# Patient Record
Sex: Female | Born: 1960 | Race: White | Hispanic: No | Marital: Married | Smoking: Current every day smoker
Health system: Southern US, Community
[De-identification: ages and names within clinical notes are randomized; demographics above are authoritative.]

## PROBLEM LIST (undated history)

## (undated) DIAGNOSIS — E079 Disorder of thyroid, unspecified: Secondary | ICD-10-CM

## (undated) DIAGNOSIS — R109 Unspecified abdominal pain: Secondary | ICD-10-CM

## (undated) DIAGNOSIS — F419 Anxiety disorder, unspecified: Secondary | ICD-10-CM

## (undated) DIAGNOSIS — E039 Hypothyroidism, unspecified: Secondary | ICD-10-CM

## (undated) DIAGNOSIS — E785 Hyperlipidemia, unspecified: Secondary | ICD-10-CM

## (undated) HISTORY — DX: Disorder of thyroid, unspecified: E07.9

## (undated) HISTORY — DX: Hyperlipidemia, unspecified: E78.5

## (undated) HISTORY — PX: ABDOMINAL HYSTERECTOMY: SHX81

---

## 1986-03-30 HISTORY — PX: TUBAL LIGATION: SHX77

## 1994-07-28 HISTORY — PX: HYSTEROSCOPY: SHX211

## 2005-08-29 ENCOUNTER — Ambulatory Visit: Payer: Self-pay | Admitting: Pediatrics

## 2007-08-19 ENCOUNTER — Ambulatory Visit: Payer: Self-pay | Admitting: Family Medicine

## 2009-01-09 ENCOUNTER — Ambulatory Visit: Payer: Self-pay | Admitting: Family Medicine

## 2011-08-21 ENCOUNTER — Ambulatory Visit: Payer: Self-pay | Admitting: Family Medicine

## 2013-04-05 ENCOUNTER — Ambulatory Visit: Payer: Self-pay | Admitting: Family Medicine

## 2017-11-02 ENCOUNTER — Other Ambulatory Visit: Payer: Self-pay | Admitting: Family Medicine

## 2017-11-02 DIAGNOSIS — Z1231 Encounter for screening mammogram for malignant neoplasm of breast: Secondary | ICD-10-CM

## 2018-11-05 ENCOUNTER — Other Ambulatory Visit: Payer: Self-pay | Admitting: Family Medicine

## 2018-11-05 DIAGNOSIS — R1084 Generalized abdominal pain: Secondary | ICD-10-CM

## 2018-11-05 DIAGNOSIS — Z1231 Encounter for screening mammogram for malignant neoplasm of breast: Secondary | ICD-10-CM

## 2018-11-15 ENCOUNTER — Other Ambulatory Visit: Payer: Self-pay

## 2018-11-15 ENCOUNTER — Ambulatory Visit
Admission: RE | Admit: 2018-11-15 | Discharge: 2018-11-15 | Disposition: A | Discharge status: Home / Self Care | Source: Ambulatory Visit | Attending: Family Medicine | Admitting: Family Medicine

## 2018-11-15 DIAGNOSIS — R1084 Generalized abdominal pain: Secondary | ICD-10-CM

## 2018-12-09 ENCOUNTER — Ambulatory Visit (INDEPENDENT_AMBULATORY_CARE_PROVIDER_SITE_OTHER): Admitting: General Surgery

## 2018-12-09 ENCOUNTER — Ambulatory Visit: Payer: Self-pay

## 2018-12-09 ENCOUNTER — Encounter: Payer: Self-pay | Admitting: General Surgery

## 2018-12-09 ENCOUNTER — Other Ambulatory Visit: Payer: Self-pay

## 2018-12-09 VITALS — BP 110/75 | HR 82 | Temp 97.5°F | Ht 63.0 in | Wt 118.6 lb

## 2018-12-09 DIAGNOSIS — E041 Nontoxic single thyroid nodule: Secondary | ICD-10-CM

## 2018-12-09 DIAGNOSIS — K8020 Calculus of gallbladder without cholecystitis without obstruction: Secondary | ICD-10-CM

## 2018-12-09 DIAGNOSIS — E079 Disorder of thyroid, unspecified: Secondary | ICD-10-CM | POA: Diagnosis not present

## 2018-12-09 NOTE — Patient Instructions (Signed)
You have requested to have your gallbladder removed. This will be done at [facility] with Dr. Lady Garyannon.  You will most likely be out of work 1-2 weeks for this surgery. You will return after your post-op appointment with a lifting restriction for approximately 4 more weeks.  You will be able to eat anything you would like to following surgery. But, start by eating a bland diet and advance this as tolerated. The Gallbladder diet is below, please go as closely by this diet as possible prior to surgery to avoid any further attacks.  Please see the Pre Surgery form that you have been given today. If you have any questions, please call our office.  Laparoscopic Cholecystectomy Laparoscopic cholecystectomy is surgery to remove the gallbladder. The gallbladder is located in the upper right part of the abdomen, behind the liver. It is a storage sac for bile, which is produced in the liver. Bile aids in the digestion and absorption of fats. Cholecystectomy is often done for inflammation of the gallbladder (cholecystitis). This condition is usually caused by a buildup of gallstones (cholelithiasis) in the gallbladder. Gallstones can block the flow of bile, and that can result in inflammation and pain. In severe cases, emergency surgery may be required. If emergency surgery is not required, you will have time to prepare for the procedure. Laparoscopic surgery is an alternative to open surgery. Laparoscopic surgery has a shorter recovery time. Your common bile duct may also need to be examined during the procedure. If stones are found in the common bile duct, they may be removed. LET [facility] PROVIDER KNOW ABOUT:  Any allergies you have.  All medicines you are taking, including vitamins, herbs, eye drops, creams, and over-the-counter medicines.  Previous problems you or members of your family have had with the use of anesthetics.  Any blood disorders you have.  Previous surgeries you have  had.    Any medical conditions you have. RISKS AND COMPLICATIONS Generally, this is a safe procedure. However, problems may occur, including:  Infection.  Bleeding.  Allergic reactions to medicines.  Damage to other structures or organs.  A stone remaining in the common bile duct.  A bile leak from the cyst duct that is clipped when your gallbladder is removed.  The need to convert to open surgery, which requires a larger incision in the abdomen. This may be necessary if your surgeon thinks that it is not safe to continue with a laparoscopic procedure. BEFORE THE PROCEDURE  Ask your health care provider about:  Changing or stopping your regular medicines. This is especially important if you are taking diabetes medicines or blood thinners.  Taking medicines such as aspirin and ibuprofen. These medicines can thin your blood. Do not take these medicines before your procedure if your health care provider instructs you not to.  Follow instructions from your health care provider about eating or drinking restrictions.  Let your health care provider know if you develop a cold or an infection before surgery.  Plan to have someone take you home after the procedure.  Ask your health care provider how your surgical site will be marked or identified.  You may be given antibiotic medicine to help prevent infection. PROCEDURE  To reduce your risk of infection:  Your health care team will wash or sanitize their hands.  Your skin will be washed with soap.  An IV tube may be inserted into one of your veins.  You will be given a medicine to make you  fall asleep (general anesthetic).  A breathing tube will be placed in your mouth.  The surgeon will make several small cuts (incisions) in your abdomen.  A thin, lighted tube (laparoscope) that has a tiny camera on the end will be inserted through one of the small incisions. The camera on the laparoscope will send a picture to a TV  screen (monitor) in the operating room. This will give the surgeon a good view inside your abdomen.  A gas will be pumped into your abdomen. This will expand your abdomen to give the surgeon more room to perform the surgery.  Other tools that are needed for the procedure will be inserted through the other incisions. The gallbladder will be removed through one of the incisions.  After your gallbladder has been removed, the incisions will be closed with stitches (sutures), staples, or skin glue.  Your incisions may be covered with a bandage (dressing). The procedure may vary among health care providers and hospitals. AFTER THE PROCEDURE  Your blood pressure, heart rate, breathing rate, and blood oxygen level will be monitored often until the medicines you were given have worn off.  You will be given medicines as needed to control your pain.   This information is not intended to replace advice given to you by your health care provider. Make sure you discuss any questions you have with your health care provider.   Document Released: 03/24/2005 Document Revised: 12/13/2014 Document Reviewed: 11/03/2012 Elsevier Interactive Patient Education 2016 [place] Diet for Gallbladder Conditions A low-fat diet can be helpful if you have pancreatitis or a gallbladder condition. With these conditions, your pancreas and gallbladder have trouble digesting fats. A healthy eating plan with less fat will help rest your pancreas and gallbladder and reduce your symptoms. WHAT DO I NEED TO KNOW ABOUT THIS DIET?  Eat a low-fat diet.  Reduce your fat intake to less than 20-30% of your total daily calories. This is less than 50-60 g of fat per day.  Remember that you need some fat in your diet. Ask your dietician what your daily goal should be.  Choose nonfat and low-fat healthy foods. Look for the words "nonfat," "low fat," or "fat free."  As a guide, look on the label and choose foods with  less than 3 g of fat per serving. Eat only one serving.  Avoid alcohol.  Do not smoke. If you need help quitting, talk with your health care provider.  Eat small frequent meals instead of three large heavy meals. WHAT FOODS CAN I EAT? Grains Include healthy grains and starches such as potatoes, wheat bread, fiber-rich cereal, and brown rice. Choose whole grain options whenever possible. In adults, whole grains should account for 45-65% of your daily calories.  Fruits and Vegetables Eat plenty of fruits and vegetables. Fresh fruits and vegetables add fiber to your diet. Meats and Other Protein Sources Eat lean meat such as chicken and pork. Trim any fat off of meat before cooking it. Eggs, fish, and beans are other sources of protein. In adults, these foods should account for 10-35% of your daily calories. Dairy Choose low-fat milk and dairy options. Dairy includes fat and protein, as well as calcium.  Fats and Oils Limit high-fat foods such as fried foods, sweets, baked goods, sugary drinks.  Other Creamy sauces and condiments, such as mayonnaise, can add extra fat. Think about whether or not you need to use them, or use smaller amounts or low fat options. WHAT  FOODS ARE NOT RECOMMENDED?  High fat foods, such as:  [employer].  Ice cream.  [place] toast.  Sweet rolls.  Pizza.  Cheese bread.  Foods covered with batter, butter, creamy sauces, or cheese.  Fried foods.  Sugary drinks and desserts.  Foods that cause gas or bloating   This information is not intended to replace advice given to you by your health care provider. Make sure you discuss any questions you have with your health care provider.   Document Released: 03/29/2013 Document Reviewed: 03/29/2013 Elsevier Interactive Patient Education [employer]

## 2018-12-09 NOTE — H&P (View-Only) (Signed)
Patient ID: Joan Johns, female   DOB: 05/31/1960, 58 y.o.   MRN: [id number]  Chief Complaint  Patient presents with  . New Patient (Initial Visit)    gallbladder    HPI Joan Johns is a 58 y.o. female.  She was referred by her primary care provider, Dr. Linda Miles, for surgical evaluation of gallstones.  She reports having several months history of a "uncomfortable" feeling in her right upper quadrant.  The symptoms are localized to the area and last between 30 to 60 minutes.  They come and go about once every couple of weeks.  Occasionally she has nausea associated with the pain.  She endorses emesis once in a while, first thing in the morning after strings or coffee, but this is not associated with right upper quadrant pain or discomfort.  She says that acidic or spicy foods seem to aggravate and bring on the discomfort.  She denies association with fatty food intake.  She has not had any fevers or chills.  She denies ever having become jaundiced or having had pancreatitis.  She has never had any surgery in her upper abdomen; she has had a tubal ligation and partial hysterectomy in the past.   Past Medical History:  Diagnosis Date  . Hyperlipidemia   . Thyroid disease    hypothyroidism    Past Surgical History:  Procedure Laterality Date  . ABDOMINAL HYSTERECTOMY    . HYSTEROSCOPY  07/28/1994  . TUBAL LIGATION  03/30/1986    Family History  Problem Relation Age of Onset  . Celiac disease Sister     Social History Social History   Tobacco Use  . Smoking status: Current Every Day Smoker    Packs/day: 1.00  . Smokeless tobacco: Never Used  Substance Use Topics  . Alcohol use: Never    Frequency: Never  . Drug use: Never    Allergies  Allergen Reactions  . Ciprofloxacin Nausea And Vomiting    syncope  . Codeine Rash    Current Outpatient Medications  Medication Sig Dispense Refill  . Cholecalciferol (VITAMIN D3 PO) Take by mouth. Takes 1000 IU daily    .  levothyroxine (SYNTHROID) 75 MCG tablet Take 75 mcg by mouth daily.     No current facility-administered medications for this visit.     Review of Systems Review of Systems  All other systems reviewed and are negative.   Blood pressure 110/75, pulse 82, temperature (!) 97.5 F (36.4 C), height 5' 3" (1.6 m), weight 118 lb 9.6 oz (53.8 kg), SpO2 98 %. Today's Vitals   12/09/18 1436  BP: 110/75  Pulse: 82  Temp: (!) 97.5 F (36.4 C)  SpO2: 98%  Weight: 118 lb 9.6 oz (53.8 kg)  Height: 5' 3" (1.6 m)   Body mass index is 21.01 kg/m.  Physical Exam Physical Exam Constitutional:      General: She is not in acute distress.    Appearance: Normal appearance. She is normal weight.  HENT:     Head: Normocephalic and atraumatic.     Nose:     Comments: Covered with a mask secondary to COVID-19 precautions    Mouth/Throat:     Comments: Covered with a mask secondary to COVID-19 precautions Eyes:     General: No scleral icterus.       Right eye: No discharge.        Left eye: No discharge.     Conjunctiva/sclera: Conjunctivae normal.  Neck:       Musculoskeletal: Normal range of motion. No neck rigidity.     Comments: There is an approximately 1 cm irregularity versus thyroid nodule palpated on each side of the gland.  When moves freely with deglutition. Cardiovascular:     Rate and Rhythm: Normal rate and regular rhythm.     Pulses: Normal pulses.  Pulmonary:     Effort: Pulmonary effort is normal.     Breath sounds: Normal breath sounds.  Abdominal:     General: Abdomen is flat. Bowel sounds are normal. There is no distension.     Palpations: Abdomen is soft.     Tenderness: There is no abdominal tenderness. There is no guarding.  Genitourinary:    Comments: Deferred Musculoskeletal: Normal range of motion.        General: No swelling.  Lymphadenopathy:     Cervical: No cervical adenopathy.  Skin:    General: Skin is warm and dry.  Neurological:     General: No focal  deficit present.     Mental Status: She is alert and oriented to person, place, and time.  Psychiatric:        Behavior: Behavior normal.     Data Reviewed I reviewed the right upper quadrant ultrasound that was performed on August 10.  This describes Coley lithiasis without cholecystitis or choledocho lithiasis.  I concur with the radiologist impression, which is copied below:CLINICAL DATA:  Right upper quadrant abdominal pain.  EXAM: ULTRASOUND ABDOMEN LIMITED RIGHT UPPER QUADRANT  COMPARISON:  None.  FINDINGS: Gallbladder:  Cholelithiasis is noted without gallbladder wall thickening or pericholecystic fluid. Largest calculus measures 4 mm. No sonographic Murphy's sign is noted.  Common bile duct:  Diameter: 5 mm which is within normal limits.  Liver:  No focal lesion identified. Within normal limits in parenchymal echogenicity. Portal vein is patent on color Doppler imaging with normal direction of blood flow towards the liver.  Other: None.  IMPRESSION: Cholelithiasis without evidence of cholecystitis. No other abnormality seen in the right upper quadrant of the abdomen.  Dr. Lennox Grumbles' clinic note of November 29, 2018 was reviewed in hardcopy.  This included labs, including a CBC which was within normal limits a comprehensive metabolic panel, also within normal limits, specifically the transaminases and bilirubin.  Cholesterol is elevated with a total cholesterol of 229.  Hemoglobin A1c is normal.  Vitamin D is slightly low at 28.6.  Thyroid-stimulating hormone is on the low side of normal at 0.756   Assessment This is a 58 year old woman with right upper quadrant pain, symptoms most consistent with biliary colic.  She does have gallstones on ultrasound imaging.  She has no evidence of cholecystitis or choledocholithiasis.  She is also hypothyroid and on physical examination, I felt an irregularity on either side of the gland.  She is not sure why she is hypothyroid,  except that her lab work indicated as much and she was started on levothyroxine as result.   Plan I performed an ultrasound in clinic today to evaluate her thyroid.  What I appreciated as a possible nodule on physical exam, proved to be parenchymal irregularity distorting the contour of the gland.  The imaging is classic for Hashimoto's thyroiditis and the irregularities appear to be germinal centers rather than discrete nodules.  This certainly explains her hypothyroid state.  There were no nodules or other findings that would warrant biopsy at this time.  As for her gallbladder, I have recommended that she undergo cholecystectomy.The risks, benefits, complications, treatment options, and expected outcomes  were discussed with the patient. The possibilities of bleeding, recurrent infection, finding a normal gallbladder, perforation of viscus organs, damage to surrounding structures, bile leak, abscess formation, needing a drain placed, the need for additional procedures, reaction to medication, pulmonary aspiration,  failure to diagnose a condition, the possible need to convert to an open procedure, and creating a complication requiring transfusion or further operation were discussed with the patient. The patient and/or family concurred with the proposed plan, giving informed consent.  We will get her scheduled for an operation.    Duanne GuessJennifer Lawarence Meek 12/09/2018, 5:07 PM

## 2018-12-09 NOTE — Addendum Note (Signed)
Addended by: Fredirick Maudlin on: 12/09/2018 05:45 PM   Modules accepted: Orders, SmartSet

## 2018-12-09 NOTE — Progress Notes (Signed)
Patient ID: Joan Johns, female   DOB: 1961-01-28, 58 y.o.   MRN: [id number]  Chief Complaint  Patient presents with  . New Patient (Initial Visit)    gallbladder    HPI Joan Johns is a 58 y.o. female.  She was referred by her primary care provider, Dr. Darreld Mclean, for surgical evaluation of gallstones.  She reports having several months history of a "uncomfortable" feeling in her right upper quadrant.  The symptoms are localized to the area and last between 30 to 60 minutes.  They come and go about once every couple of weeks.  Occasionally she has nausea associated with the pain.  She endorses emesis once in a while, first thing in the morning after strings or coffee, but this is not associated with right upper quadrant pain or discomfort.  She says that acidic or spicy foods seem to aggravate and bring on the discomfort.  She denies association with fatty food intake.  She has not had any fevers or chills.  She denies ever having become jaundiced or having had pancreatitis.  She has never had any surgery in her upper abdomen; she has had a tubal ligation and partial hysterectomy in the past.   Past Medical History:  Diagnosis Date  . Hyperlipidemia   . Thyroid disease    hypothyroidism    Past Surgical History:  Procedure Laterality Date  . ABDOMINAL HYSTERECTOMY    . HYSTEROSCOPY  07/28/1994  . TUBAL LIGATION  03/30/1986    Family History  Problem Relation Age of Onset  . Celiac disease Sister     Social History Social History   Tobacco Use  . Smoking status: Current Every Day Smoker    Packs/day: 1.00  . Smokeless tobacco: Never Used  Substance Use Topics  . Alcohol use: Never    Frequency: Never  . Drug use: Never    Allergies  Allergen Reactions  . Ciprofloxacin Nausea And Vomiting    syncope  . Codeine Rash    Current Outpatient Medications  Medication Sig Dispense Refill  . Cholecalciferol (VITAMIN D3 PO) Take by mouth. Takes 1000 IU daily    .  levothyroxine (SYNTHROID) 75 MCG tablet Take 75 mcg by mouth daily.     No current facility-administered medications for this visit.     Review of Systems Review of Systems  All other systems reviewed and are negative.   Blood pressure 110/75, pulse 82, temperature (!) 97.5 F (36.4 C), height 5\' 3"  (1.6 m), weight 118 lb 9.6 oz (53.8 kg), SpO2 98 %. Today's Vitals   12/09/18 1436  BP: 110/75  Pulse: 82  Temp: (!) 97.5 F (36.4 C)  SpO2: 98%  Weight: 118 lb 9.6 oz (53.8 kg)  Height: 5\' 3"  (1.6 m)   Body mass index is 21.01 kg/m.  Physical Exam Physical Exam Constitutional:      General: She is not in acute distress.    Appearance: Normal appearance. She is normal weight.  HENT:     Head: Normocephalic and atraumatic.     Nose:     Comments: Covered with a mask secondary to COVID-19 precautions    Mouth/Throat:     Comments: Covered with a mask secondary to COVID-19 precautions Eyes:     General: No scleral icterus.       Right eye: No discharge.        Left eye: No discharge.     Conjunctiva/sclera: Conjunctivae normal.  Neck:  Musculoskeletal: Normal range of motion. No neck rigidity.     Comments: There is an approximately 1 cm irregularity versus thyroid nodule palpated on each side of the gland.  When moves freely with deglutition. Cardiovascular:     Rate and Rhythm: Normal rate and regular rhythm.     Pulses: Normal pulses.  Pulmonary:     Effort: Pulmonary effort is normal.     Breath sounds: Normal breath sounds.  Abdominal:     General: Abdomen is flat. Bowel sounds are normal. There is no distension.     Palpations: Abdomen is soft.     Tenderness: There is no abdominal tenderness. There is no guarding.  Genitourinary:    Comments: Deferred Musculoskeletal: Normal range of motion.        General: No swelling.  Lymphadenopathy:     Cervical: No cervical adenopathy.  Skin:    General: Skin is warm and dry.  Neurological:     General: No focal  deficit present.     Mental Status: She is alert and oriented to person, place, and time.  Psychiatric:        Behavior: Behavior normal.     Data Reviewed I reviewed the right upper quadrant ultrasound that was performed on August 10.  This describes Coley lithiasis without cholecystitis or choledocho lithiasis.  I concur with the radiologist impression, which is copied below:CLINICAL DATA:  Right upper quadrant abdominal pain.  EXAM: ULTRASOUND ABDOMEN LIMITED RIGHT UPPER QUADRANT  COMPARISON:  None.  FINDINGS: Gallbladder:  Cholelithiasis is noted without gallbladder wall thickening or pericholecystic fluid. Largest calculus measures 4 mm. No sonographic Murphy's sign is noted.  Common bile duct:  Diameter: 5 mm which is within normal limits.  Liver:  No focal lesion identified. Within normal limits in parenchymal echogenicity. Portal vein is patent on color Doppler imaging with normal direction of blood flow towards the liver.  Other: None.  IMPRESSION: Cholelithiasis without evidence of cholecystitis. No other abnormality seen in the right upper quadrant of the abdomen.  Dr. Lennox Grumbles' clinic note of November 29, 2018 was reviewed in hardcopy.  This included labs, including a CBC which was within normal limits a comprehensive metabolic panel, also within normal limits, specifically the transaminases and bilirubin.  Cholesterol is elevated with a total cholesterol of 229.  Hemoglobin A1c is normal.  Vitamin D is slightly low at 28.6.  Thyroid-stimulating hormone is on the low side of normal at 0.756   Assessment This is a 58 year old woman with right upper quadrant pain, symptoms most consistent with biliary colic.  She does have gallstones on ultrasound imaging.  She has no evidence of cholecystitis or choledocholithiasis.  She is also hypothyroid and on physical examination, I felt an irregularity on either side of the gland.  She is not sure why she is hypothyroid,  except that her lab work indicated as much and she was started on levothyroxine as result.   Plan I performed an ultrasound in clinic today to evaluate her thyroid.  What I appreciated as a possible nodule on physical exam, proved to be parenchymal irregularity distorting the contour of the gland.  The imaging is classic for Hashimoto's thyroiditis and the irregularities appear to be germinal centers rather than discrete nodules.  This certainly explains her hypothyroid state.  There were no nodules or other findings that would warrant biopsy at this time.  As for her gallbladder, I have recommended that she undergo cholecystectomy.The risks, benefits, complications, treatment options, and expected outcomes  were discussed with the patient. The possibilities of bleeding, recurrent infection, finding a normal gallbladder, perforation of viscus organs, damage to surrounding structures, bile leak, abscess formation, needing a drain placed, the need for additional procedures, reaction to medication, pulmonary aspiration,  failure to diagnose a condition, the possible need to convert to an open procedure, and creating a complication requiring transfusion or further operation were discussed with the patient. The patient and/or family concurred with the proposed plan, giving informed consent.  We will get her scheduled for an operation.    Duanne GuessJennifer Ludwig Tugwell 12/09/2018, 5:07 PM

## 2018-12-10 ENCOUNTER — Ambulatory Visit
Admission: RE | Admit: 2018-12-10 | Discharge: 2018-12-10 | Disposition: A | Discharge status: Home / Self Care | Source: Ambulatory Visit | Attending: Family Medicine | Admitting: Family Medicine

## 2018-12-10 DIAGNOSIS — Z1231 Encounter for screening mammogram for malignant neoplasm of breast: Secondary | ICD-10-CM | POA: Diagnosis not present

## 2018-12-14 ENCOUNTER — Telehealth: Payer: Self-pay | Admitting: General Surgery

## 2018-12-14 NOTE — Telephone Encounter (Signed)
Pt advised of pre op date/time and sx date. Sx: 12/20/18 with Dr Wilburn Cornelia cholecystectomy.  Pre op: 12/15/18 between 1-5:00pm-Phone interview.   Covid testing date-12/16/18 [facility] between 8-10:30am.  Patient made aware to call [phone], between 1-3:00pm the day before surgery, to find out what time to arrive.

## 2018-12-15 ENCOUNTER — Other Ambulatory Visit: Payer: Self-pay

## 2018-12-15 ENCOUNTER — Encounter
Admission: RE | Admit: 2018-12-15 | Discharge: 2018-12-15 | Disposition: A | Discharge status: Home / Self Care | Source: Ambulatory Visit | Attending: General Surgery | Admitting: General Surgery

## 2018-12-15 HISTORY — DX: Unspecified abdominal pain: R10.9

## 2018-12-15 HISTORY — DX: Hypothyroidism, unspecified: E03.9

## 2018-12-15 HISTORY — DX: Anxiety disorder, unspecified: F41.9

## 2018-12-15 NOTE — Patient Instructions (Signed)
Your procedure is scheduled on: 12/20/2018 Mon Report to Same Day Surgery 2nd floor medical mall [facility] Entrance-take elevator on left to 2nd floor.  Check in with surgery information desk.) To find out your arrival time please call [phone] between 1PM - 3PM on 12/17/2018 Fri  Remember: Instructions that are not followed completely may result in serious medical risk, up to and including death, or upon the discretion of your surgeon and anesthesiologist your surgery may need to be rescheduled.    _x___ 1. Do not eat food after midnight the night before your procedure. You may drink clear liquids up to 2 hours before you are scheduled to arrive at the hospital for your procedure.  Do not drink clear liquids within 2 hours of your scheduled arrival to the hospital.  Clear liquids include  --Water or Apple juice without pulp  --Clear carbohydrate beverage such as ClearFast or Gatorade  --Black Coffee or Clear Tea (No milk, no creamers, do not add anything to                  the coffee or Tea Type 1 and type 2 diabetics should only drink water.   ____Ensure clear carbohydrate drink on the way to the hospital for bariatric patients  ____Ensure clear carbohydrate drink 3 hours before surgery.   No gum chewing or hard candies.     __x__ 2. No Alcohol for 24 hours before or after surgery.   __x__3. No Smoking or e-cigarettes for 24 prior to surgery.  Do not use any chewable tobacco products for at least 6 hour prior to surgery   ____  4. Bring all medications with you on the day of surgery if instructed.    __x__ 5. Notify your doctor if there is any change in your medical condition     (cold, fever, infections).    x___6. On the morning of surgery brush your teeth with toothpaste and water.  You may rinse your mouth with mouth wash if you wish.  Do not swallow any toothpaste or mouthwash.   Do not wear jewelry, make-up, hairpins, clips or nail polish.  Do not wear lotions,  powders, or perfumes. You may wear deodorant.  Do not shave 48 hours prior to surgery. Men may shave face and neck.  Do not bring valuables to the hospital.    [facility] is not responsible for any belongings or valuables.               Contacts, dentures or bridgework may not be worn into surgery.  Leave your suitcase in the car. After surgery it may be brought to your room.  For patients admitted to the hospital, discharge time is determined by your                       treatment team.  _  Patients discharged the day of surgery will not be allowed to drive home.  You will need someone to drive you home and stay with you the night of your procedure.    Please read over the following fact sheets that you were given:   [facility] Preparing for Surgery and or MRSA Information   _x___ Take anti-hypertensive listed below, cardiac, seizure, asthma,     anti-reflux and psychiatric medicines. These include:  1. levothyroxine (SYNTHROID) 75 MCG tablet  2.  3.  4.  5.  6.  ____Fleets enema or Magnesium Citrate as directed.   _x___  Use CHG Soap or sage wipes as directed on instruction sheet   ____ Use inhalers on the day of surgery and bring to hospital day of surgery  ____ Stop Metformin and Janumet 2 days prior to surgery.    ____ Take 1/2 of usual insulin dose the night before surgery and none on the morning     surgery.   _x___ Follow recommendations from Cardiologist, Pulmonologist or PCP regarding          stopping Aspirin, Coumadin, Plavix ,Eliquis, Effient, or Pradaxa, and Pletal.  X____Stop Anti-inflammatories such as Advil, Aleve, Ibuprofen, Motrin, Naproxen, Naprosyn, Goodies powders or aspirin products. OK to take Tylenol and                          Celebrex.   _x___ Stop supplements until after surgery.  But may continue Vitamin D, Vitamin B,       and multivitamin.   ____ Bring C-Pap to the hospital.

## 2018-12-16 ENCOUNTER — Other Ambulatory Visit: Payer: Self-pay

## 2018-12-16 ENCOUNTER — Other Ambulatory Visit
Admission: RE | Admit: 2018-12-16 | Discharge: 2018-12-16 | Disposition: A | Discharge status: Home / Self Care | Source: Ambulatory Visit | Attending: General Surgery | Admitting: General Surgery

## 2018-12-16 DIAGNOSIS — Z20828 Contact with and (suspected) exposure to other viral communicable diseases: Secondary | ICD-10-CM | POA: Diagnosis not present

## 2018-12-16 DIAGNOSIS — Z01812 Encounter for preprocedural laboratory examination: Secondary | ICD-10-CM | POA: Diagnosis not present

## 2018-12-16 LAB — SARS CORONAVIRUS 2 (TAT 6-24 HRS): SARS Coronavirus 2: NEGATIVE

## 2018-12-20 ENCOUNTER — Ambulatory Visit
Admission: RE | Admit: 2018-12-20 | Discharge: 2018-12-20 | Disposition: A | Discharge status: Home / Self Care | Attending: General Surgery | Admitting: General Surgery

## 2018-12-20 ENCOUNTER — Other Ambulatory Visit: Payer: Self-pay

## 2018-12-20 ENCOUNTER — Ambulatory Visit: Admitting: Anesthesiology

## 2018-12-20 ENCOUNTER — Encounter
Admission: RE | Disposition: A | Discharge status: Home / Self Care | Payer: Self-pay | Source: Home / Self Care | Attending: General Surgery

## 2018-12-20 DIAGNOSIS — K8010 Calculus of gallbladder with chronic cholecystitis without obstruction: Secondary | ICD-10-CM | POA: Insufficient documentation

## 2018-12-20 DIAGNOSIS — F17210 Nicotine dependence, cigarettes, uncomplicated: Secondary | ICD-10-CM | POA: Diagnosis not present

## 2018-12-20 DIAGNOSIS — Z885 Allergy status to narcotic agent status: Secondary | ICD-10-CM | POA: Insufficient documentation

## 2018-12-20 DIAGNOSIS — E785 Hyperlipidemia, unspecified: Secondary | ICD-10-CM | POA: Diagnosis not present

## 2018-12-20 DIAGNOSIS — K8020 Calculus of gallbladder without cholecystitis without obstruction: Secondary | ICD-10-CM | POA: Diagnosis not present

## 2018-12-20 DIAGNOSIS — Z881 Allergy status to other antibiotic agents status: Secondary | ICD-10-CM | POA: Diagnosis not present

## 2018-12-20 DIAGNOSIS — E039 Hypothyroidism, unspecified: Secondary | ICD-10-CM | POA: Diagnosis not present

## 2018-12-20 DIAGNOSIS — Z79890 Hormone replacement therapy (postmenopausal): Secondary | ICD-10-CM | POA: Insufficient documentation

## 2018-12-20 HISTORY — PX: CHOLECYSTECTOMY: SHX55

## 2018-12-20 SURGERY — LAPAROSCOPIC CHOLECYSTECTOMY
Anesthesia: General | Site: Abdomen

## 2018-12-20 MED ORDER — MIDAZOLAM HCL 2 MG/2ML IJ SOLN
INTRAMUSCULAR | Status: AC
  Filled 2018-12-20: qty 2

## 2018-12-20 MED ORDER — LACTATED RINGERS IV SOLN
INTRAVENOUS | Status: DC
  Administered 2018-12-20: 13:00:00 via INTRAVENOUS

## 2018-12-20 MED ORDER — LIDOCAINE HCL (CARDIAC) PF 100 MG/5ML IV SOSY
PREFILLED_SYRINGE | INTRAVENOUS | Status: DC | PRN
  Administered 2018-12-20: 80 mg via INTRAVENOUS

## 2018-12-20 MED ORDER — CEFAZOLIN SODIUM-DEXTROSE 2-4 GM/100ML-% IV SOLN
INTRAVENOUS | Status: AC
  Filled 2018-12-20: qty 100

## 2018-12-20 MED ORDER — ACETAMINOPHEN 500 MG PO TABS
1000.0000 mg | ORAL_TABLET | ORAL | Status: AC
  Administered 2018-12-20: 09:00:00 1000 mg via ORAL

## 2018-12-20 MED ORDER — SODIUM CHLORIDE FLUSH 0.9 % IV SOLN
INTRAVENOUS | Status: AC
  Filled 2018-12-20: qty 10

## 2018-12-20 MED ORDER — PROPOFOL 10 MG/ML IV BOLUS
INTRAVENOUS | Status: AC
  Filled 2018-12-20: qty 20

## 2018-12-20 MED ORDER — MIDAZOLAM HCL 2 MG/2ML IJ SOLN
INTRAMUSCULAR | Status: DC | PRN
  Administered 2018-12-20: 2 mg via INTRAVENOUS

## 2018-12-20 MED ORDER — PROMETHAZINE HCL 25 MG/ML IJ SOLN
6.2500 mg | INTRAMUSCULAR | Status: DC | PRN
  Administered 2018-12-20: 15:00:00 6.25 mg via INTRAVENOUS

## 2018-12-20 MED ORDER — HYDROCODONE-ACETAMINOPHEN 5-325 MG PO TABS
ORAL_TABLET | ORAL | Status: AC
  Filled 2018-12-20: qty 1

## 2018-12-20 MED ORDER — DEXAMETHASONE SODIUM PHOSPHATE 10 MG/ML IJ SOLN
INTRAMUSCULAR | Status: DC | PRN
  Administered 2018-12-20: 10 mg via INTRAVENOUS

## 2018-12-20 MED ORDER — ROCURONIUM BROMIDE 100 MG/10ML IV SOLN
INTRAVENOUS | Status: DC | PRN
  Administered 2018-12-20: 40 mg via INTRAVENOUS

## 2018-12-20 MED ORDER — HYDROCODONE-ACETAMINOPHEN 5-325 MG PO TABS
1.0000 | ORAL_TABLET | Freq: Once | ORAL | Status: AC
  Administered 2018-12-20: 16:00:00 1 via ORAL

## 2018-12-20 MED ORDER — CHLORHEXIDINE GLUCONATE CLOTH 2 % EX PADS: 6.0000 | MEDICATED_PAD | Freq: Once | CUTANEOUS | Status: DC

## 2018-12-20 MED ORDER — LIDOCAINE-EPINEPHRINE 1 %-1:100000 IJ SOLN
INTRAMUSCULAR | Status: DC | PRN
  Administered 2018-12-20: 19 mL via INTRAMUSCULAR

## 2018-12-20 MED ORDER — LIDOCAINE-EPINEPHRINE 1 %-1:100000 IJ SOLN
INTRAMUSCULAR | Status: AC
  Filled 2018-12-20: qty 1

## 2018-12-20 MED ORDER — ONDANSETRON HCL 4 MG/2ML IJ SOLN
INTRAMUSCULAR | Status: DC | PRN
  Administered 2018-12-20: 4 mg via INTRAVENOUS

## 2018-12-20 MED ORDER — BUPIVACAINE HCL (PF) 0.25 % IJ SOLN
INTRAMUSCULAR | Status: AC
  Filled 2018-12-20: qty 30

## 2018-12-20 MED ORDER — PROMETHAZINE HCL 25 MG/ML IJ SOLN
INTRAMUSCULAR | Status: AC
  Filled 2018-12-20: qty 1

## 2018-12-20 MED ORDER — SUGAMMADEX SODIUM 500 MG/5ML IV SOLN
INTRAVENOUS | Status: DC | PRN
  Administered 2018-12-20: 200 mg via INTRAVENOUS

## 2018-12-20 MED ORDER — ACETAMINOPHEN 500 MG PO TABS
ORAL_TABLET | ORAL | Status: AC
  Administered 2018-12-20: 09:00:00 1000 mg via ORAL
  Filled 2018-12-20: qty 2

## 2018-12-20 MED ORDER — PHENYLEPHRINE HCL (PRESSORS) 10 MG/ML IV SOLN
INTRAVENOUS | Status: DC | PRN
  Administered 2018-12-20: 100 ug via INTRAVENOUS

## 2018-12-20 MED ORDER — FENTANYL CITRATE (PF) 250 MCG/5ML IJ SOLN
INTRAMUSCULAR | Status: AC
  Filled 2018-12-20: qty 5

## 2018-12-20 MED ORDER — CELECOXIB 200 MG PO CAPS
200.0000 mg | ORAL_CAPSULE | ORAL | Status: AC
  Administered 2018-12-20: 09:00:00 200 mg via ORAL

## 2018-12-20 MED ORDER — PROPOFOL 10 MG/ML IV BOLUS
INTRAVENOUS | Status: DC | PRN
  Administered 2018-12-20: 120 mg via INTRAVENOUS

## 2018-12-20 MED ORDER — CHLORHEXIDINE GLUCONATE CLOTH 2 % EX PADS
6.0000 | MEDICATED_PAD | Freq: Once | CUTANEOUS | Status: AC
  Administered 2018-12-20: 6 via TOPICAL

## 2018-12-20 MED ORDER — CELECOXIB 200 MG PO CAPS
ORAL_CAPSULE | ORAL | Status: AC
  Administered 2018-12-20: 09:00:00 200 mg via ORAL
  Filled 2018-12-20: qty 1

## 2018-12-20 MED ORDER — FENTANYL CITRATE (PF) 100 MCG/2ML IJ SOLN
INTRAMUSCULAR | Status: DC | PRN
  Administered 2018-12-20 (×2): 50 ug via INTRAVENOUS
  Administered 2018-12-20: 100 ug via INTRAVENOUS
  Administered 2018-12-20: 50 ug via INTRAVENOUS

## 2018-12-20 MED ORDER — ONDANSETRON HCL 4 MG/2ML IJ SOLN
INTRAMUSCULAR | Status: AC
  Filled 2018-12-20: qty 2

## 2018-12-20 MED ORDER — IBUPROFEN 800 MG PO TABS: 800.0000 mg | ORAL_TABLET | Freq: Three times a day (TID) | ORAL | 0 refills | Status: DC | PRN

## 2018-12-20 MED ORDER — HYDROCODONE-ACETAMINOPHEN 5-325 MG PO TABS: 1.0000 | ORAL_TABLET | Freq: Four times a day (QID) | ORAL | 0 refills | Status: DC | PRN

## 2018-12-20 MED ORDER — CEFAZOLIN SODIUM-DEXTROSE 2-4 GM/100ML-% IV SOLN
2.0000 g | INTRAVENOUS | Status: AC
  Administered 2018-12-20: 2 g via INTRAVENOUS

## 2018-12-20 MED ORDER — FENTANYL CITRATE (PF) 100 MCG/2ML IJ SOLN
25.0000 ug | INTRAMUSCULAR | Status: DC | PRN
  Administered 2018-12-20 (×2): 25 ug via INTRAVENOUS

## 2018-12-20 MED ORDER — FAMOTIDINE 20 MG PO TABS
ORAL_TABLET | ORAL | Status: AC
  Administered 2018-12-20: 09:00:00 20 mg via ORAL
  Filled 2018-12-20: qty 1

## 2018-12-20 MED ORDER — FAMOTIDINE 20 MG PO TABS
20.0000 mg | ORAL_TABLET | Freq: Once | ORAL | Status: AC
  Administered 2018-12-20: 09:00:00 20 mg via ORAL

## 2018-12-20 MED ORDER — FENTANYL CITRATE (PF) 100 MCG/2ML IJ SOLN
INTRAMUSCULAR | Status: AC
  Administered 2018-12-20: 15:00:00 25 ug via INTRAVENOUS
  Filled 2018-12-20: qty 2

## 2018-12-20 MED ORDER — DEXMEDETOMIDINE HCL 200 MCG/2ML IV SOLN
INTRAVENOUS | Status: DC | PRN
  Administered 2018-12-20: 16 ug via INTRAVENOUS
  Administered 2018-12-20: 12 ug via INTRAVENOUS

## 2018-12-20 SURGICAL SUPPLY — 45 items
APPLIER CLIP 5 13 M/L LIGAMAX5 (MISCELLANEOUS) ×2
BLADE SURG SZ11 CARB STEEL (BLADE) ×2 IMPLANT
CANISTER SUCT 1200ML W/VALVE (MISCELLANEOUS) ×2 IMPLANT
CHLORAPREP W/TINT 26 (MISCELLANEOUS) ×2 IMPLANT
CLIP APPLIE 5 13 M/L LIGAMAX5 (MISCELLANEOUS) ×1 IMPLANT
COVER WAND RF STERILE (DRAPES) ×2 IMPLANT
DECANTER SPIKE VIAL GLASS SM (MISCELLANEOUS) ×4 IMPLANT
DEFOGGER SCOPE WARMER CLEARIFY (MISCELLANEOUS) ×2 IMPLANT
DERMABOND ADVANCED (GAUZE/BANDAGES/DRESSINGS) ×1
DERMABOND ADVANCED .7 DNX12 (GAUZE/BANDAGES/DRESSINGS) ×1 IMPLANT
ELECT CAUTERY BLADE TIP 2.5 (TIP) ×2
ELECT REM PT RETURN 9FT ADLT (ELECTROSURGICAL) ×2
ELECTRODE CAUTERY BLDE TIP 2.5 (TIP) ×1 IMPLANT
ELECTRODE REM PT RTRN 9FT ADLT (ELECTROSURGICAL) ×1 IMPLANT
GLOVE BIO SURGEON STRL SZ 6.5 (GLOVE) ×8 IMPLANT
GLOVE INDICATOR 7.0 STRL GRN (GLOVE) ×8 IMPLANT
GOWN STRL REUS W/ TWL LRG LVL3 (GOWN DISPOSABLE) ×4 IMPLANT
GOWN STRL REUS W/TWL LRG LVL3 (GOWN DISPOSABLE) ×4
GRASPER SUT TROCAR 14GX15 (MISCELLANEOUS) IMPLANT
IRRIGATION STRYKERFLOW (MISCELLANEOUS) ×1 IMPLANT
IRRIGATOR STRYKERFLOW (MISCELLANEOUS) ×2
IV NS 1000ML (IV SOLUTION) ×1
IV NS 1000ML BAXH (IV SOLUTION) ×1 IMPLANT
KIT TURNOVER KIT A (KITS) ×2 IMPLANT
LABEL OR SOLS (LABEL) ×2 IMPLANT
NEEDLE HYPO 22GX1.5 SAFETY (NEEDLE) ×4 IMPLANT
NS IRRIG 500ML POUR BTL (IV SOLUTION) ×2 IMPLANT
PACK LAP CHOLECYSTECTOMY (MISCELLANEOUS) ×2 IMPLANT
PENCIL ELECTRO HAND CTR (MISCELLANEOUS) ×2 IMPLANT
POUCH SPECIMEN RETRIEVAL 10MM (ENDOMECHANICALS) ×2 IMPLANT
SCISSORS METZENBAUM CVD 33 (INSTRUMENTS) ×2 IMPLANT
SET TUBE SMOKE EVAC HIGH FLOW (TUBING) ×2 IMPLANT
SLEEVE ADV FIXATION 5X100MM (TROCAR) ×4 IMPLANT
SOLUTION ELECTROLUBE (MISCELLANEOUS) ×2 IMPLANT
STRIP CLOSURE SKIN 1/2X4 (GAUZE/BANDAGES/DRESSINGS) ×2 IMPLANT
SUT MNCRL 4-0 (SUTURE) ×1
SUT MNCRL 4-0 27XMFL (SUTURE) ×1
SUT VIC AB 3-0 SH 27 (SUTURE) ×1
SUT VIC AB 3-0 SH 27X BRD (SUTURE) ×1 IMPLANT
SUT VICRYL 0 AB UR-6 (SUTURE) ×6 IMPLANT
SUTURE MNCRL 4-0 27XMF (SUTURE) ×1 IMPLANT
TROCAR ADV FIXATION 12X100MM (TROCAR) IMPLANT
TROCAR BALLN GELPORT 12X130M (ENDOMECHANICALS) ×2 IMPLANT
TROCAR Z-THREAD OPTICAL 5X100M (TROCAR) ×2 IMPLANT
WATER STERILE IRR 1000ML POUR (IV SOLUTION) ×2 IMPLANT

## 2018-12-20 NOTE — Anesthesia Preprocedure Evaluation (Signed)
Anesthesia Evaluation  Patient identified by MRN, date of birth, ID band Patient awake    Reviewed: Allergy & Precautions, H&P , NPO status , Patient's Chart, lab work & pertinent test results, reviewed documented beta blocker date and time   History of Anesthesia Complications Negative for: history of anesthetic complications  Airway Mallampati: III  TM Distance: >3 FB Neck ROM: full    Dental  (+) Dental Advidsory Given, Missing, Poor Dentition   Pulmonary neg shortness of breath, neg COPD, neg recent URI, Current Smoker and Patient abstained from smoking.,    Pulmonary exam normal        Cardiovascular Exercise Tolerance: Good negative cardio ROS Normal cardiovascular exam     Neuro/Psych PSYCHIATRIC DISORDERS Anxiety negative neurological ROS     GI/Hepatic negative GI ROS, Neg liver ROS,   Endo/Other  neg diabetesHypothyroidism   Renal/GU negative Renal ROS  negative genitourinary   Musculoskeletal   Abdominal   Peds  Hematology negative hematology ROS (+)   Anesthesia Other Findings Past Medical History: No date: Abdominal pain No date: Anxiety     Comment:  when face covered No date: Hyperlipidemia No date: Hypothyroidism No date: Thyroid disease     Comment:  hypothyroidism   Reproductive/Obstetrics negative OB ROS                             Anesthesia Physical Anesthesia Plan  ASA: II  Anesthesia Plan: General   Post-op Pain Management:    Induction: Intravenous  PONV Risk Score and Plan: 2 and Ondansetron, Dexamethasone, Midazolam, Promethazine and Treatment may vary due to age or medical condition  Airway Management Planned: Oral ETT  Additional Equipment:   Intra-op Plan:   Post-operative Plan: Extubation in OR  Informed Consent: I have reviewed the patients History and Physical, chart, labs and discussed the procedure including the risks, benefits and  alternatives for the proposed anesthesia with the patient or authorized representative who has indicated his/her understanding and acceptance.     Dental Advisory Given  Plan Discussed with: Anesthesiologist, CRNA and Surgeon  Anesthesia Plan Comments:         Anesthesia Quick Evaluation

## 2018-12-20 NOTE — Interval H&P Note (Signed)
History and Physical Interval Note:  12/20/2018 12:30 PM  Joan Johns  has presented today for surgery, with the diagnosis of SX CHOLELITHIASIS.  The various methods of treatment have been discussed with the patient and family. After consideration of risks, benefits and other options for treatment, the patient has consented to  Procedure(s): LAPAROSCOPIC CHOLECYSTECTOMY (N/A) as a surgical intervention.  The patient's history has been reviewed, patient examined, no change in status, stable for surgery.  I have reviewed the patient's chart and labs.  Questions were answered to the patient's satisfaction.     Fredirick Maudlin

## 2018-12-20 NOTE — Anesthesia Procedure Notes (Signed)
Procedure Name: Intubation Date/Time: 12/20/2018 12:49 PM Performed by: Justus Memory, CRNA Pre-anesthesia Checklist: Patient identified, Patient being monitored, Timeout performed, Emergency Drugs available and Suction available Patient Re-evaluated:Patient Re-evaluated prior to induction Oxygen Delivery Method: Circle system utilized Preoxygenation: Pre-oxygenation with 100% oxygen Induction Type: IV induction Ventilation: Mask ventilation without difficulty Laryngoscope Size: Mac and 3 Grade View: Grade II Tube type: Oral Tube size: 7.0 mm Number of attempts: 1 Airway Equipment and Method: Stylet and Patient positioned with wedge pillow Placement Confirmation: ETT inserted through vocal cords under direct vision,  positive ETCO2 and breath sounds checked- equal and bilateral Secured at: 21 cm Tube secured with: Tape Dental Injury: Teeth and Oropharynx as per pre-operative assessment

## 2018-12-20 NOTE — Anesthesia Post-op Follow-up Note (Signed)
Anesthesia QCDR form completed.        

## 2018-12-20 NOTE — Discharge Instructions (Signed)

## 2018-12-20 NOTE — Transfer of Care (Signed)
Immediate Anesthesia Transfer of Care Note  Patient: Joan Johns  Procedure(s) Performed: LAPAROSCOPIC CHOLECYSTECTOMY (N/A Abdomen)  Patient Location: PACU  Anesthesia Type:General  Level of Consciousness: sedated  Airway & Oxygen Therapy: Patient Spontanous Breathing and Patient connected to face mask oxygen  Post-op Assessment: Report given to RN and Post -op Vital signs reviewed and stable  Post vital signs: Reviewed and stable  Last Vitals:  Vitals Value Taken Time  BP 122/54 12/20/18 1418  Temp 36.4 C 12/20/18 1415  Pulse 73 12/20/18 1420  Resp 17 12/20/18 1420  SpO2 95 % 12/20/18 1420  Vitals shown include unvalidated device data.  Last Pain:  Vitals:   12/20/18 1415  TempSrc:   PainSc: Asleep         Complications: No apparent anesthesia complications

## 2018-12-20 NOTE — Op Note (Signed)
Laparoscopic Cholecystectomy  Pre-operative Diagnosis: Symptomatic cholelithiasis  Post-operative Diagnosis: Same  Procedure: Laparoscopic cholecystectomy  Surgeon: Fredirick Maudlin, MD  Anesthesia: GETA  Assistant: None   Findings: No inflammation appreciated, stones within the gallbladder, no concern for cholecystitis   Estimated Blood Loss: Less than 5 cc         Drains: None         Specimens: Gallbladder           Complications: none   Procedure Details  The patient was seen again in the preoperative holding area. The benefits, complications, treatment options, and expected outcomes were discussed with the patient. The risks of bleeding, infection, recurrence of symptoms, failure to resolve symptoms, bile duct damage, bile duct leak, retained common bile duct stone, bowel injury, any of which could require further surgery and/or ERCP, stent, or papillotomy were reviewed with the patient. The likelihood of improving the patient's symptoms with return to their baseline status is good.  The patient and/or family concurred with the proposed plan, giving informed consent.  The patient was taken to operating room, identified as Joan Johns and the procedure verified as Laparoscopic Cholecystectomy. A time out was performed and the above information confirmed.  Prior to the induction of general anesthesia, antibiotic prophylaxis was administered. VTE prophylaxis was in place. General endotracheal anesthesia was then administered and tolerated well. After the induction, the abdomen was prepped with Chloraprep and draped in the sterile fashion. The patient was positioned in the supine position.  Cut down technique was used to enter the abdominal cavity and a Hasson trochar was placed after two vicryl stitches were anchored to the fascia. Pneumoperitoneum was then created with CO2 and tolerated well without any adverse changes in the patient's vital signs.  Three 5-mm ports were placed in  the right upper quadrant all under direct vision. All skin incisions  were infiltrated with a local anesthetic agent before making the incision and placing the trocars.   The patient was positioned  in reverse Trendelenburg, tilted slightly to the patient's left.  The gallbladder was identified, the fundus grasped and retracted cephalad. Adhesions were lysed bluntly. The infundibulum was grasped and retracted laterally, exposing the peritoneum overlying the triangle of Calot. This was then divided and exposed in a blunt fashion. An extended critical view of the cystic duct and cystic artery was obtained.  The cystic duct was clearly identified and bluntly dissected free. Both the cystic artery and duct were double clipped and divided.  The gallbladder was taken from the gallbladder fossa in a retrograde fashion with the electrocautery. The gallbladder was removed and placed in an Endo pouch bag. The liver bed was irrigated and inspected. Hemostasis was achieved with the electrocautery. Copious saline irrigation was utilized and was repeatedly aspirated until clear.  The gallbladder and Endo pouch sac were then removed through a port site.   Inspection of the right upper quadrant was performed. No bleeding, bile duct injury or leak, or bowel injury was noted. Pneumoperitoneum was released.  The periumbilical port site was closed with interrumpted 0 Vicryl sutures. 4-0 subcuticular Monocryl was used to close the skin. Dermabond was applied.  The patient was then extubated and brought to the recovery room in stable condition. Sponge, lap, and needle counts were correct at closure and at the conclusion of the case.               Fredirick Maudlin, MD, FACS

## 2018-12-23 LAB — SURGICAL PATHOLOGY

## 2018-12-23 NOTE — Anesthesia Postprocedure Evaluation (Signed)
Anesthesia Post Note  Patient: Joan Johns  Procedure(s) Performed: LAPAROSCOPIC CHOLECYSTECTOMY (N/A Abdomen)  Patient location during evaluation: PACU Anesthesia Type: General Level of consciousness: awake and alert Pain management: pain level controlled Vital Signs Assessment: post-procedure vital signs reviewed and stable Respiratory status: spontaneous breathing, nonlabored ventilation and respiratory function stable Cardiovascular status: blood pressure returned to baseline and stable Postop Assessment: no apparent nausea or vomiting Anesthetic complications: no     Last Vitals:  Vitals:   12/20/18 1542 12/20/18 1610  BP: 119/86 121/83  Pulse: 67 69  Resp: 16 16  Temp:    SpO2: 100% 99%    Last Pain:  Vitals:   12/21/18 0908  TempSrc:   PainSc: 2                  Alphonsus Sias

## 2018-12-30 ENCOUNTER — Other Ambulatory Visit: Payer: Self-pay

## 2018-12-30 ENCOUNTER — Encounter: Payer: Self-pay | Admitting: General Surgery

## 2018-12-30 ENCOUNTER — Ambulatory Visit (INDEPENDENT_AMBULATORY_CARE_PROVIDER_SITE_OTHER): Admitting: General Surgery

## 2018-12-30 VITALS — BP 115/82 | HR 89 | Temp 97.7°F | Resp 12 | Ht 63.0 in | Wt 118.0 lb

## 2018-12-30 DIAGNOSIS — Z9049 Acquired absence of other specified parts of digestive tract: Secondary | ICD-10-CM

## 2018-12-30 NOTE — Progress Notes (Signed)
Joan Johns is here today for postoperative visit after undergoing an uncomplicated laparoscopic cholecystectomy on 20 December 2018.  Final pathology was benign.  She states that she has been doing well since her operation although she did have some postop nausea that has since resolved.  She has required very little in the way of pain medication.  She is eating well.  She has had slightly loose stools, but they are improving.  Today's Vitals   12/30/18 1356  BP: 115/82  Pulse: 89  Resp: 12  Temp: 97.7 F (36.5 C)  TempSrc: Temporal  SpO2: 97%  Weight: 118 lb (53.5 kg)  Height: 5\' 3"  (1.6 m)  PainSc: 0-No pain   Body mass index is 20.9 kg/m. Focused abdominal exam: The laparoscopic port sites are all healing nicely.  There is no erythema, induration, or purulent drainage seen.  Impression and plan: This is a 58 year old woman who underwent a cholecystectomy for symptomatic cholelithiasis.  She states that she feels much better now that her gallbladder is out.  She may resume all of her usual activities, with the caveat that she should continue to refrain from lifting anything heavier than 10 pounds for an additional 2 weeks.  We will see her on an as-needed basis.

## 2018-12-30 NOTE — Patient Instructions (Signed)
GENERAL POST-OPERATIVE PATIENT INSTRUCTIONS   WOUND CARE INSTRUCTIONS:  Keep a dry clean dressing on the wound if there is drainage. The initial bandage may be removed after 24 hours.  Once the wound has quit draining you may leave it open to air.  If clothing rubs against the wound or causes irritation and the wound is not draining you may cover it with a dry dressing during the daytime.  Try to keep the wound dry and avoid ointments on the wound unless directed to do so.  If the wound becomes bright red and painful or starts to drain infected material that is not clear, please contact your physician immediately.  If the wound is mildly pink and has a thick firm ridge underneath it, this is normal, and is referred to as a healing ridge.  This will resolve over the next 4-6 weeks.  BATHING: You may shower if you have been informed of this by your surgeon. However, Please do not submerge in a tub, hot tub, or pool until incisions are completely sealed or have been told by your surgeon that you may do so.  DIET:  You may eat any foods that you can tolerate.  It is a good idea to eat a high fiber diet and take in plenty of fluids to prevent constipation.  If you do become constipated you may want to take a mild laxative or take ducolax tablets on a daily basis until your bowel habits are regular.  Constipation can be very uncomfortable, along with straining, after recent surgery.  ACTIVITY:  You are encouraged to cough and deep breath or use your incentive spirometer if you were given one, every 15-30 minutes when awake.  This will help prevent respiratory complications and low grade fevers post-operatively if you had a general anesthetic.  You may want to hug a pillow when coughing and sneezing to add additional support to the surgical area, if you had abdominal or chest surgery, which will decrease pain during these times.  You are encouraged to walk and engage in light activity for the next two weeks.  You  should not lift more than 20 pounds, until 01/31/2019 as it could put you at increased risk for complications.  Twenty pounds is roughly equivalent to a plastic bag of groceries. At that time- Listen to your body when lifting, if you have pain when lifting, stop and then try again in a few days. Soreness after doing exercises or activities of daily living is normal as you get back in to your normal routine.  MEDICATIONS:  Try to take narcotic medications and anti-inflammatory medications, such as tylenol, ibuprofen, naprosyn, etc., with food.  This will minimize stomach upset from the medication.  Should you develop nausea and vomiting from the pain medication, or develop a rash, please discontinue the medication and contact your physician.  You should not drive, make important decisions, or operate machinery when taking narcotic pain medication.  SUNBLOCK Use sun block to incision area over the next year if this area will be exposed to sun. This helps decrease scarring and will allow you avoid a permanent darkened area over your incision.  QUESTIONS:  Please feel free to call our office if you have any questions, and we will be glad to assist you. [phone]    

## 2020-07-19 ENCOUNTER — Other Ambulatory Visit: Payer: Self-pay | Admitting: Family Medicine

## 2020-07-19 DIAGNOSIS — Z1231 Encounter for screening mammogram for malignant neoplasm of breast: Secondary | ICD-10-CM

## 2021-01-14 ENCOUNTER — Encounter: Payer: Self-pay | Admitting: General Surgery

## 2021-01-21 IMAGING — MG MM DIGITAL SCREENING BILAT W/ TOMO W/ CAD
8 series · 9 of 24 positions shown · non-contrast
Comparison: Previous exam(s).

CLINICAL DATA: Screening.

EXAM:
DIGITAL SCREENING BILATERAL MAMMOGRAM WITH TOMO AND CAD

[R CC synth-2D]
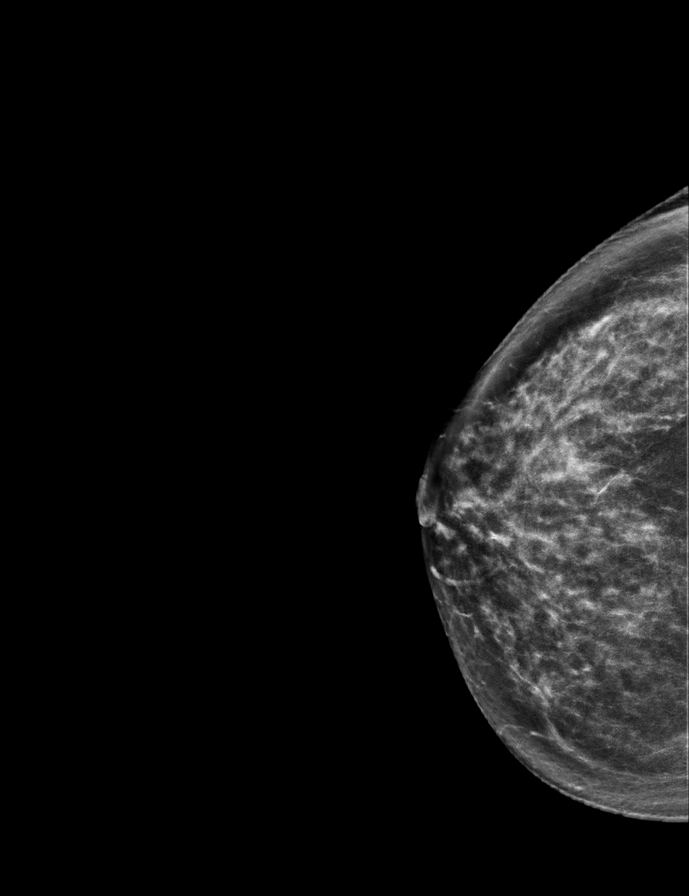

[R MLO synth-2D]
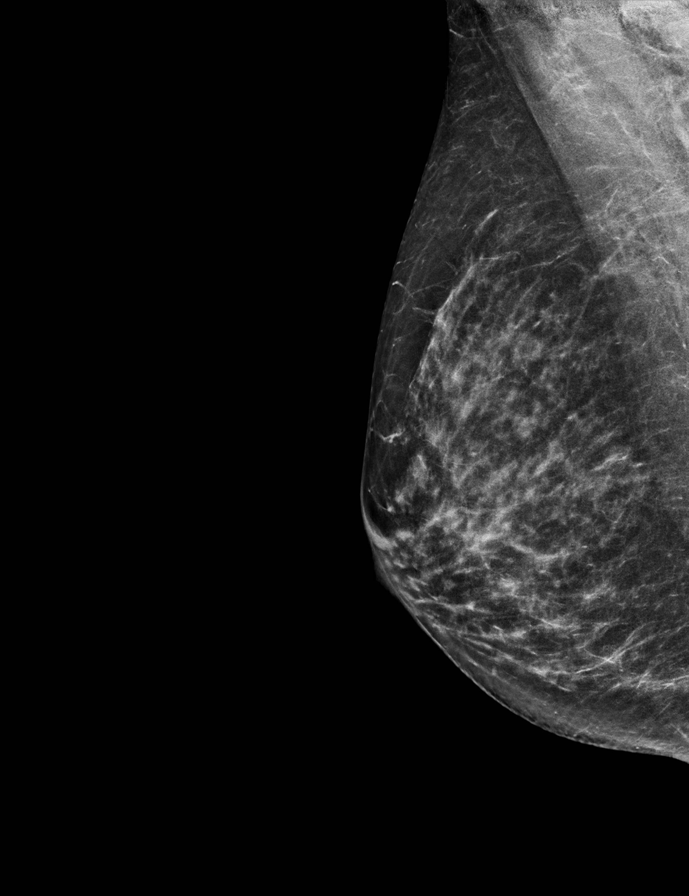

[L MLO synth-2D]
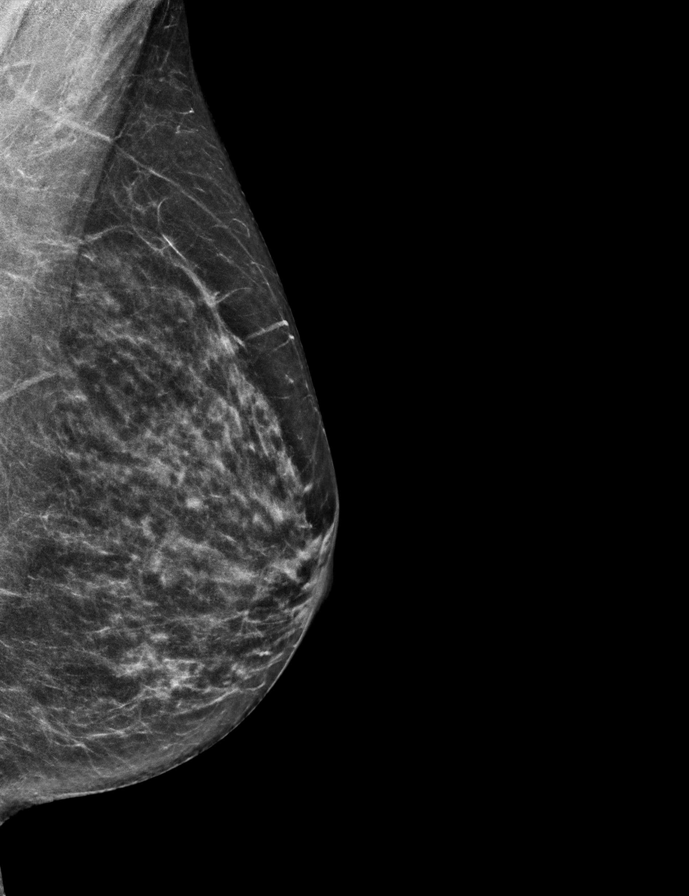

[L CC synth-2D]
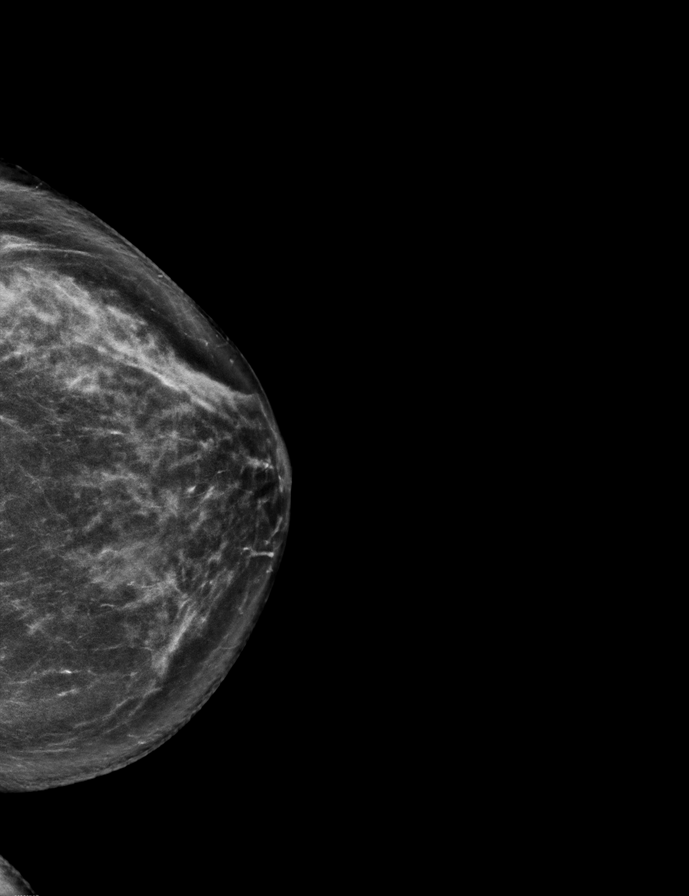

[L CC tomo · 2 of 66 frames shown]
[frame 22/66]
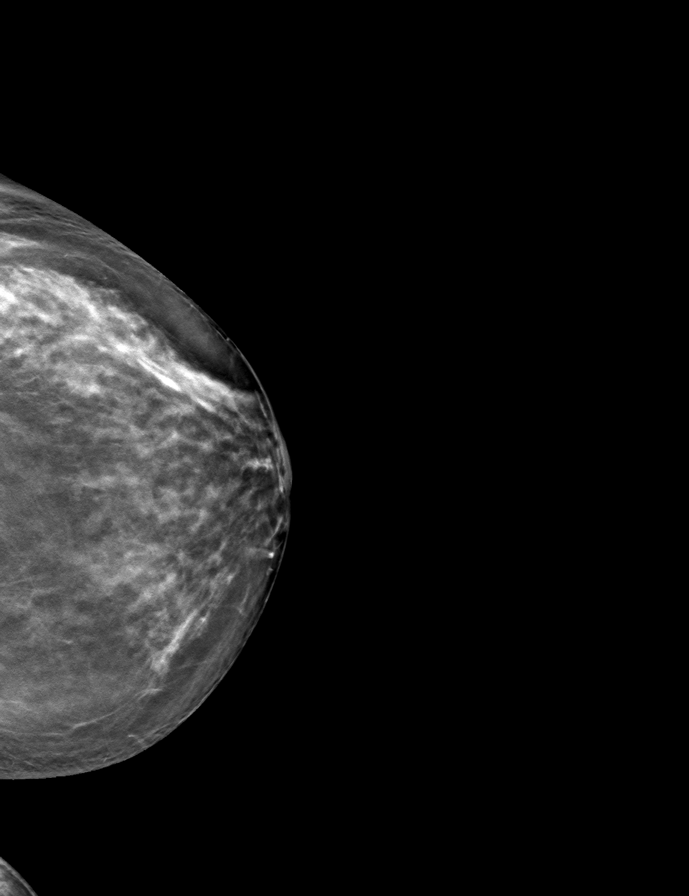
[frame 33/66]
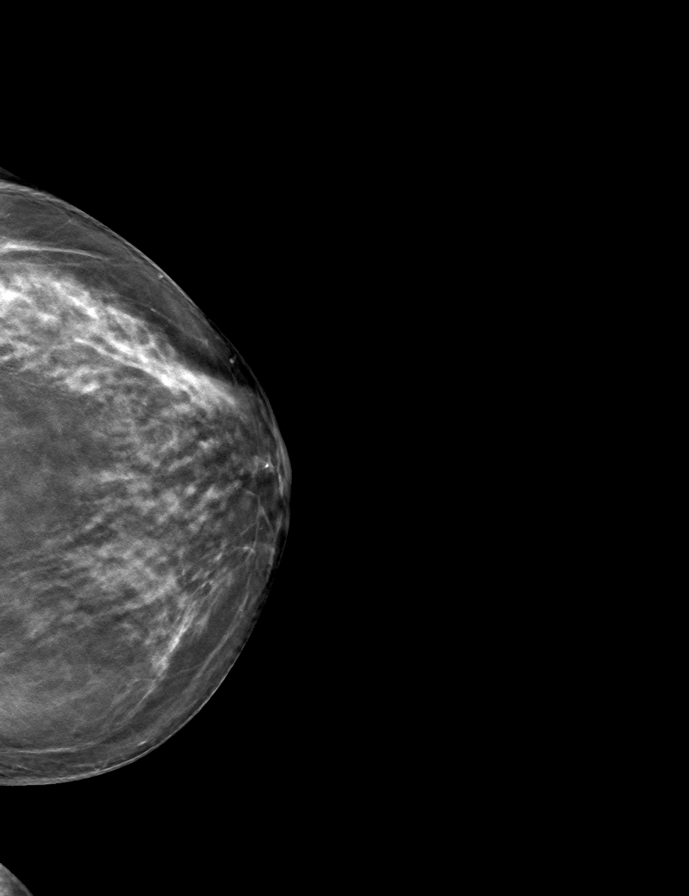

[R MLO tomo · tomo slice 29/58.0]
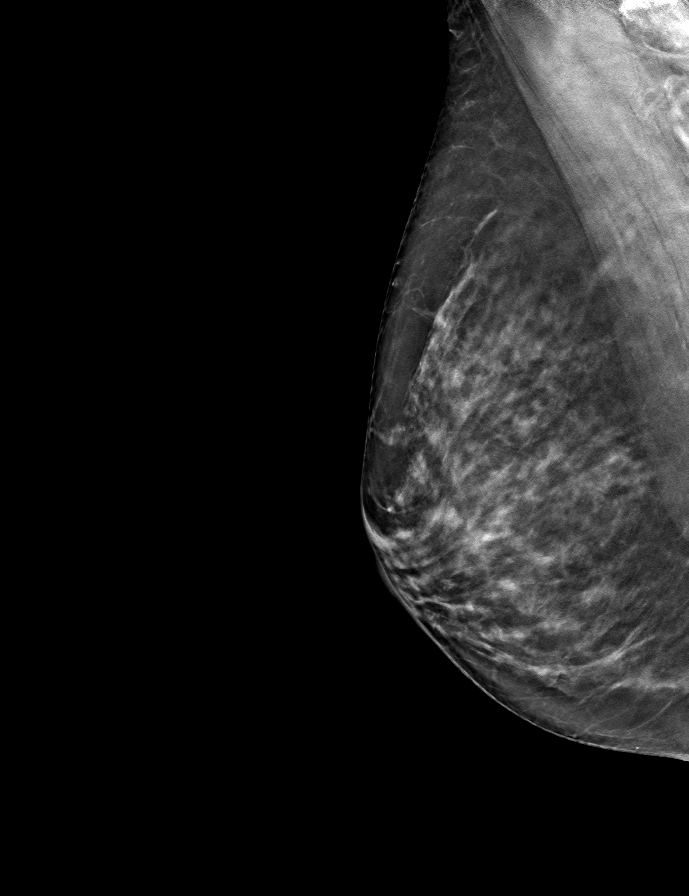

[R CC tomo · tomo slice 34/67.0]
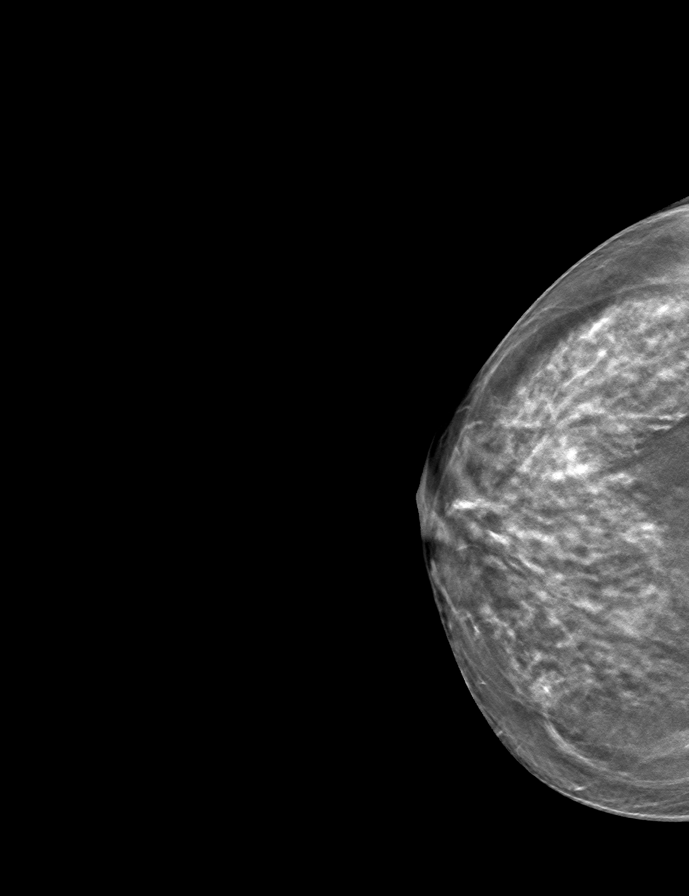

[L MLO tomo · tomo slice 29/56.0]
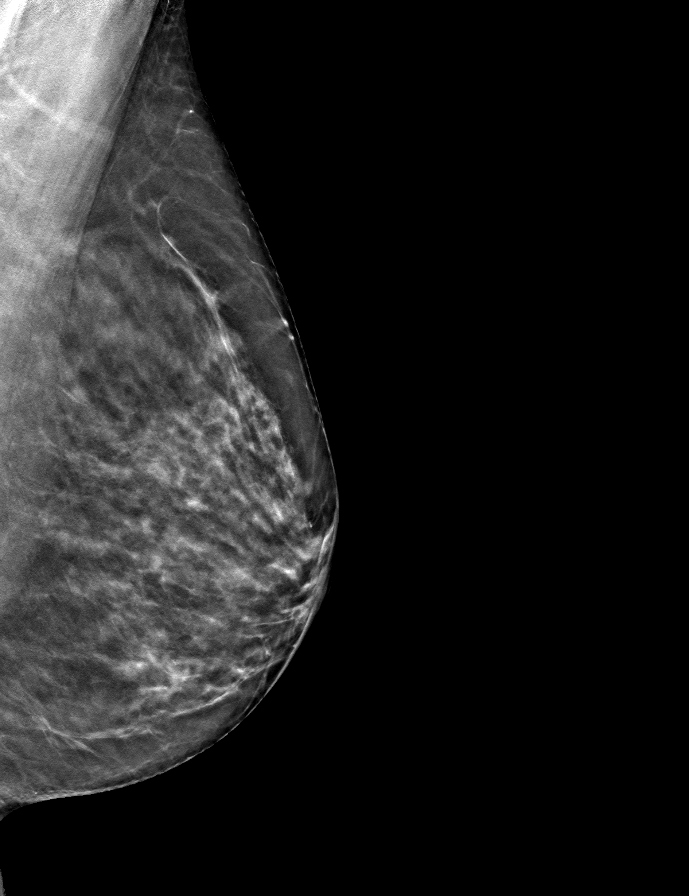

[9 of 24 positions shown; findings below may reference images not displayed]

ACR Breast Density Category c: The breast tissue is heterogeneously
dense, which may obscure small masses.
FINDINGS: There are no findings suspicious for malignancy. Images were
processed with CAD.
IMPRESSION: No mammographic evidence of malignancy. A result letter of this
screening mammogram will be mailed directly to the patient.

RECOMMENDATION:
Screening mammogram in one year. (Code:[id number]-U-LHB)

BI-RADS CATEGORY  1: Negative.

## 2021-10-02 ENCOUNTER — Other Ambulatory Visit: Payer: Self-pay | Admitting: Family Medicine

## 2021-10-02 DIAGNOSIS — Z1231 Encounter for screening mammogram for malignant neoplasm of breast: Secondary | ICD-10-CM

## 2021-10-28 ENCOUNTER — Ambulatory Visit
Admission: RE | Admit: 2021-10-28 | Discharge: 2021-10-28 | Disposition: A | Discharge status: Home / Self Care | Source: Ambulatory Visit | Attending: Family Medicine | Admitting: Family Medicine

## 2021-10-28 DIAGNOSIS — Z1231 Encounter for screening mammogram for malignant neoplasm of breast: Secondary | ICD-10-CM | POA: Diagnosis present

## 2021-11-14 ENCOUNTER — Other Ambulatory Visit (INDEPENDENT_AMBULATORY_CARE_PROVIDER_SITE_OTHER): Payer: Self-pay | Admitting: Nurse Practitioner

## 2021-11-14 DIAGNOSIS — I998 Other disorder of circulatory system: Secondary | ICD-10-CM

## 2021-11-15 ENCOUNTER — Other Ambulatory Visit (INDEPENDENT_AMBULATORY_CARE_PROVIDER_SITE_OTHER): Payer: Self-pay | Admitting: Nurse Practitioner

## 2021-11-15 ENCOUNTER — Ambulatory Visit (INDEPENDENT_AMBULATORY_CARE_PROVIDER_SITE_OTHER)

## 2021-11-15 ENCOUNTER — Encounter (INDEPENDENT_AMBULATORY_CARE_PROVIDER_SITE_OTHER): Payer: Self-pay | Admitting: Nurse Practitioner

## 2021-11-15 ENCOUNTER — Ambulatory Visit (INDEPENDENT_AMBULATORY_CARE_PROVIDER_SITE_OTHER): Admitting: Nurse Practitioner

## 2021-11-15 VITALS — BP 173/97 | HR 69 | Resp 18 | Ht 63.0 in | Wt 120.8 lb

## 2021-11-15 DIAGNOSIS — G458 Other transient cerebral ischemic attacks and related syndromes: Secondary | ICD-10-CM

## 2021-11-15 DIAGNOSIS — F17200 Nicotine dependence, unspecified, uncomplicated: Secondary | ICD-10-CM

## 2021-11-15 MED ORDER — ROSUVASTATIN CALCIUM 5 MG PO TABS: 5.0000 mg | ORAL_TABLET | Freq: Every day | ORAL | 0 refills | Status: DC

## 2021-11-15 MED ORDER — ASPIRIN 81 MG PO TBEC: 81.0000 mg | DELAYED_RELEASE_TABLET | Freq: Every day | ORAL | 12 refills | Status: AC

## 2021-11-15 NOTE — Progress Notes (Signed)
Subjective:    Patient ID: Joan Johns, female    DOB: Dec 17, 1960, 61 y.o.   MRN: [id number] Chief Complaint  Patient presents with   Establish Care    Referred by Dr Marvis Moeller    Joan Johns is a 61 year old female that presents today as a referral from her PCP Dr. Marvis Moeller.  It was noted that the patient had notably different blood pressures on each arm.  Her right had a systolic pressure of 174 with a left of a systolic pressure of 137.  Currently the patient denies having any significant dizziness symptoms.  She denies any numbness or tingling in either hand.  She denies any worsening symptoms with activity or looking upward.  She denies any discoloration or cool temperatures.  Overall she notes that she has no symptoms.  She denies any fevers or chills.  She denies open wounds or ulcerations.  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  Her left vertebral demonstrates retrograde flow notable for steal symptoms.    Review of Systems  Neurological:  Negative for dizziness.  All other systems reviewed and are negative.      Objective:   Physical Exam Vitals reviewed.  HENT:     Head: Normocephalic.  Cardiovascular:     Rate and Rhythm: Normal rate.     Pulses:          Radial pulses are 1+ on the right side and 1+ on the left side.  Pulmonary:     Effort: Pulmonary effort is normal.  Skin:    General: Skin is warm and dry.     Capillary Refill: Capillary refill takes less than 2 seconds.  Neurological:     Mental Status: She is alert and oriented to person, place, and time.  Psychiatric:        Mood and Affect: Mood normal.        Behavior: Behavior normal.        Thought Content: Thought content normal.        Judgment: Judgment normal.     BP (!) 173/97 (BP Location: Right Arm)   Pulse 69   Resp 18   Ht 5\' 3"  (1.6 m)   Wt 120 lb 12.8 oz (54.8 kg)   BMI 21.40 kg/m   Past Medical History:  Diagnosis Date    Abdominal pain    Anxiety    when face covered   Hyperlipidemia    Hypothyroidism    Thyroid disease    hypothyroidism    Social History   Socioeconomic History   Marital status: Married    Spouse name: Not on file   Number of children: Not on file   Years of education: Not on file   Highest education level: Not on file  Occupational History   Not on file  Tobacco Use   Smoking status: Every Day    Packs/day: 1.00    Years: 35.00    Total pack years: 35.00    Types: Cigarettes   Smokeless tobacco: Never  Vaping Use   Vaping Use: Never used  Substance and Sexual Activity   Alcohol use: Never   Drug use: Never   Sexual activity: Not on file  Other Topics Concern   Not on file  Social History Narrative   Not on file   Social Determinants of Health   Financial Resource Strain: Not on file  Food Insecurity: Not on file  Transportation Needs: Not  on file  Physical Activity: Not on file  Stress: Not on file  Social Connections: Not on file  Intimate Partner Violence: Not on file    Past Surgical History:  Procedure Laterality Date   ABDOMINAL HYSTERECTOMY     CHOLECYSTECTOMY N/A 12/20/2018   Procedure: LAPAROSCOPIC CHOLECYSTECTOMY;  Surgeon: Duanne Guess, MD;  Location: [facility] ORS;  Service: General;  Laterality: N/A;   HYSTEROSCOPY  07/28/1994   TUBAL LIGATION  03/30/1986    Family History  Problem Relation Age of Onset   Celiac disease Sister     Allergies  Allergen Reactions   Ciprofloxacin Nausea And Vomiting    syncope   Codeine Rash        No data to display            CMP  No results found for: "NA", "K", "CL", "CO2", "GLUCOSE", "BUN", "CREATININE", "CALCIUM", "PROT", "ALBUMIN", "AST", "ALT", "ALKPHOS", "BILITOT", "GFRNONAA", "GFRAA"   No results found.     Assessment & Plan:   1. Subclavian steal syndrome The patient has evidence of subclavian steal syndrome however she does not have significant symptoms.  Based on this we will  monitor on a regular basis.  We will plan to have the patient return in 6 months for noninvasive studies.  We will also place the patient on a low-dose statin as well as aspirin.  Patient is advised that if she begins to have a significant dizziness or experiences numbness or tingling and discomfort in her left arm she should contact us for follow-up. - VAS [place] UPPER EXTREMITY ARTERIAL DUPLEX - rosuvastatin (CRESTOR) 5 MG tablet; Take 1 tablet (5 mg total) by mouth daily.  Dispense: 30 tablet; Refill: 0 - aspirin EC 81 MG tablet; Take 1 tablet (81 mg total) by mouth daily. Swallow whole.  Dispense: 30 tablet; Refill: 12  2. Tobacco use disorder Smoking cessation was discussed, 3-10 minutes spent on this topic specifically    Current Outpatient Medications on File Prior to Visit  Medication Sig Dispense Refill   diphenhydramine-acetaminophen (TYLENOL PM) 25-500 MG TABS tablet Take 2 tablets by mouth at bedtime.     levothyroxine (SYNTHROID) 75 MCG tablet Take 75 mcg by mouth daily before breakfast.      cholecalciferol (VITAMIN D) 25 MCG (1000 UT) tablet Take 1,000 Units by mouth every evening. (Patient not taking: Reported on 11/15/2021)     No current facility-administered medications on file prior to visit.    There are no Patient Instructions on file for this visit. No follow-ups on file.   Georgiana Spinner, NP

## 2021-11-27 ENCOUNTER — Other Ambulatory Visit: Payer: Self-pay | Admitting: Family Medicine

## 2021-11-27 DIAGNOSIS — F17200 Nicotine dependence, unspecified, uncomplicated: Secondary | ICD-10-CM

## 2021-12-05 ENCOUNTER — Ambulatory Visit
Admission: RE | Admit: 2021-12-05 | Discharge: 2021-12-05 | Disposition: A | Discharge status: Home / Self Care | Source: Ambulatory Visit | Attending: Family Medicine | Admitting: Family Medicine

## 2021-12-05 DIAGNOSIS — F17200 Nicotine dependence, unspecified, uncomplicated: Secondary | ICD-10-CM | POA: Insufficient documentation

## 2021-12-10 ENCOUNTER — Other Ambulatory Visit (INDEPENDENT_AMBULATORY_CARE_PROVIDER_SITE_OTHER): Payer: Self-pay | Admitting: Nurse Practitioner

## 2021-12-10 DIAGNOSIS — G458 Other transient cerebral ischemic attacks and related syndromes: Secondary | ICD-10-CM

## 2021-12-11 ENCOUNTER — Other Ambulatory Visit (INDEPENDENT_AMBULATORY_CARE_PROVIDER_SITE_OTHER): Payer: Self-pay | Admitting: Nurse Practitioner

## 2021-12-11 DIAGNOSIS — G458 Other transient cerebral ischemic attacks and related syndromes: Secondary | ICD-10-CM

## 2022-01-08 ENCOUNTER — Other Ambulatory Visit (INDEPENDENT_AMBULATORY_CARE_PROVIDER_SITE_OTHER): Payer: Self-pay | Admitting: Nurse Practitioner

## 2022-01-08 ENCOUNTER — Telehealth (INDEPENDENT_AMBULATORY_CARE_PROVIDER_SITE_OTHER): Payer: Self-pay | Admitting: Nurse Practitioner

## 2022-01-08 MED ORDER — ATORVASTATIN CALCIUM 10 MG PO TABS: 10.0000 mg | ORAL_TABLET | Freq: Every day | ORAL | 2 refills | Status: DC

## 2022-01-08 NOTE — Telephone Encounter (Signed)
Patient will like to proceed with sending in low dose Lipitor

## 2022-01-08 NOTE — Telephone Encounter (Signed)
Lipitor sent.

## 2022-01-08 NOTE — Telephone Encounter (Signed)
Patient called in stating she would like a refill on her medication but is wanting to change it before that is done. Patient is having restless nights and having trouble sleeping thinking that is a side affect of the medication.   Wanting provider or nurse to call her before refill to change medication   rosuvastatin (CRESTOR) 5 MG tablet  [id number] [address], [place]

## 2022-01-08 NOTE — Telephone Encounter (Signed)
Typically trouble sleeping and restless nights are not a common side effect of statins.  If she no longer wishes to take crestor we can try a low dose lipitor.

## 2022-04-01 ENCOUNTER — Other Ambulatory Visit (INDEPENDENT_AMBULATORY_CARE_PROVIDER_SITE_OTHER): Payer: Self-pay | Admitting: Nurse Practitioner

## 2022-05-19 ENCOUNTER — Other Ambulatory Visit (INDEPENDENT_AMBULATORY_CARE_PROVIDER_SITE_OTHER): Payer: Self-pay | Admitting: Nurse Practitioner

## 2022-05-19 DIAGNOSIS — G458 Other transient cerebral ischemic attacks and related syndromes: Secondary | ICD-10-CM

## 2022-05-20 ENCOUNTER — Ambulatory Visit (INDEPENDENT_AMBULATORY_CARE_PROVIDER_SITE_OTHER): Admitting: Vascular Surgery

## 2022-05-20 ENCOUNTER — Ambulatory Visit (INDEPENDENT_AMBULATORY_CARE_PROVIDER_SITE_OTHER)

## 2022-05-20 VITALS — BP 186/115 | Ht 63.0 in | Wt 135.0 lb

## 2022-05-20 DIAGNOSIS — E785 Hyperlipidemia, unspecified: Secondary | ICD-10-CM | POA: Diagnosis not present

## 2022-05-20 DIAGNOSIS — G458 Other transient cerebral ischemic attacks and related syndromes: Secondary | ICD-10-CM

## 2022-05-21 DIAGNOSIS — G458 Other transient cerebral ischemic attacks and related syndromes: Secondary | ICD-10-CM | POA: Insufficient documentation

## 2022-05-21 DIAGNOSIS — E785 Hyperlipidemia, unspecified: Secondary | ICD-10-CM | POA: Insufficient documentation

## 2022-05-21 NOTE — Assessment & Plan Note (Signed)
lipid control important in reducing the progression of atherosclerotic disease. Continue statin therapy  

## 2022-05-21 NOTE — Progress Notes (Signed)
MRN : [id number]  Joan Johns is a 62 y.o. (1960/09/25) female who presents with chief complaint of  Chief Complaint  Patient presents with   Follow-up    6 month carotid   Weight Gain  .  History of Present Illness: Patient returns today in follow up of subclavian steal syndrome.  The patient has been found in the past to have markedly reduced blood pressure in her left arm with a left subclavian artery stenosis or occlusion and a retrograde left vertebral artery.  She really has not had any symptoms from this.  She denies any vertebrobasilar symptoms.  She denies any left arm claudication or ulceration.  She had a very difficult time the past few months after the unexpected death of her daughter in a house fire.  This is very understandable and her blood pressure has been up for the past several weeks to months.  She still remains asymptomatic from her subclavian steal. The patient has near normal carotids on duplex today.  Her left vertebral artery is retrograde and her left subclavian artery is monophasic consistent with her known left subclavian steal syndrome.  Right vertebral artery is antegrade and right subclavian artery is multiphasic.  Current Outpatient Medications  Medication Sig Dispense Refill   aspirin EC 81 MG tablet Take 1 tablet (81 mg total) by mouth daily. Swallow whole. 30 tablet 12   atorvastatin (LIPITOR) 10 MG tablet TAKE 1 TABLET(10 MG) BY MOUTH DAILY 30 tablet 2   cholecalciferol (VITAMIN D) 25 MCG (1000 UT) tablet Take 1,000 Units by mouth every evening.     diphenhydramine-acetaminophen (TYLENOL PM) 25-500 MG TABS tablet Take 2 tablets by mouth at bedtime.     levothyroxine (SYNTHROID) 75 MCG tablet Take 75 mcg by mouth daily before breakfast.      No current facility-administered medications for this visit.    Past Medical History:  Diagnosis Date   Abdominal pain    Anxiety    when face covered   Hyperlipidemia    Hypothyroidism    Thyroid disease     hypothyroidism    Past Surgical History:  Procedure Laterality Date   ABDOMINAL HYSTERECTOMY     CHOLECYSTECTOMY N/A 12/20/2018   Procedure: LAPAROSCOPIC CHOLECYSTECTOMY;  Surgeon: Fredirick Maudlin, MD;  Location: [facility];  Service: General;  Laterality: N/A;   HYSTEROSCOPY  07/28/1994   TUBAL LIGATION  03/30/1986     Social History   Tobacco Use   Smoking status: Every Day    Packs/day: 1.00    Years: 35.00    Total pack years: 35.00    Types: Cigarettes   Smokeless tobacco: Never  Vaping Use   Vaping Use: Never used  Substance Use Topics   Alcohol use: Never   Drug use: Never      Family History  Problem Relation Age of Onset   Celiac disease Sister      Allergies  Allergen Reactions   Ciprofloxacin Nausea And Vomiting    syncope   Codeine Rash     REVIEW OF SYSTEMS (Negative unless checked)  Constitutional: []$ Weight loss  []$ Fever  []$ Chills Cardiac: []$ Chest pain   []$ Chest pressure   []$ Palpitations   []$ Shortness of breath when laying flat   []$ Shortness of breath at rest   []$ Shortness of breath with exertion. Vascular:  []$ Pain in legs with walking   []$ Pain in legs at rest   []$ Pain in legs when laying flat   []$ Claudication   []$ Pain in feet when  walking  []$ Pain in feet at rest  []$ Pain in feet when laying flat   []$ History of DVT   []$ Phlebitis   []$ Swelling in legs   []$ Varicose veins   []$ Non-healing ulcers Pulmonary:   []$ Uses home oxygen   []$ Productive cough   []$ Hemoptysis   []$ Wheeze  []$ COPD   []$ Asthma Neurologic:  []$ Dizziness  []$ Blackouts   []$ Seizures   []$ History of stroke   []$ History of TIA  []$ Aphasia   []$ Temporary blindness   []$ Dysphagia   []$ Weakness or numbness in arms   []$ Weakness or numbness in legs Musculoskeletal:  []$ Arthritis   []$ Joint swelling   []$ Joint pain   []$ Low back pain Hematologic:  []$ Easy bruising  []$ Easy bleeding   []$ Hypercoagulable state   []$ Anemic   Gastrointestinal:  []$ Blood in stool   []$ Vomiting blood  []$ Gastroesophageal reflux/heartburn    [x]$ Abdominal pain Genitourinary:  []$ Chronic kidney disease   []$ Difficult urination  []$ Frequent urination  []$ Burning with urination   []$ Hematuria Skin:  []$ Rashes   []$ Ulcers   []$ Wounds Psychological:  [x]$ History of anxiety   []$  History of major depression.  Physical Examination  BP (!) 186/115 (BP Location: Right Arm)   Ht 5' 3"$  (1.6 m)   Wt 135 lb (61.2 kg)   BMI 23.91 kg/m  Gen:  WD/WN, NAD Head: [place]/AT, No temporalis wasting. Ear/Nose/Throat: Hearing grossly intact, nares w/o erythema or drainage Eyes: Conjunctiva clear. Sclera non-icteric Neck: Supple.  Trachea midline Pulmonary:  Good air movement, no use of accessory muscles.  Cardiac: RRR, no JVD Vascular:  Vessel Right Left  Radial 2+ Palpable 1+ Palpable           Musculoskeletal: M/S 5/5 throughout.  No deformity or atrophy. No edema. Neurologic: Sensation grossly intact in extremities.  Symmetrical.  Speech is fluent.  Psychiatric: Judgment intact, Mood & affect appropriate for pt's clinical situation. Dermatologic: No rashes or ulcers noted.  No cellulitis or open wounds.      Labs No results found for this or any previous visit (from the past 2160 hour(s)).  Radiology No results found.  Assessment/Plan  Subclavian steal syndrome The patient has near normal carotids on duplex today.  Her left vertebral artery is retrograde and her left subclavian artery is monophasic consistent with her known left subclavian steal syndrome.  Right vertebral artery is antegrade and right subclavian artery is multiphasic.  She is not symptomatic from this.  Her true blood pressure is her right arm blood pressure and she is aware of this.  We discussed that I would not recommend any intervention currently given her asymptomatic status.  We will monitor this on an annual basis going forward and she will contact our office with any vertebrobasilar or left arm symptoms of concern.  Continue aspirin and statin  agent.  Hyperlipidemia lipid control important in reducing the progression of atherosclerotic disease. Continue statin therapy    Leotis Pain, MD  05/21/2022 8:14 AM    This note was created with Dragon medical transcription system.  Any errors from dictation are purely unintentional

## 2022-05-21 NOTE — Assessment & Plan Note (Signed)
The patient has near normal carotids on duplex today.  Her left vertebral artery is retrograde and her left subclavian artery is monophasic consistent with her known left subclavian steal syndrome.  Right vertebral artery is antegrade and right subclavian artery is multiphasic.  She is not symptomatic from this.  Her true blood pressure is her right arm blood pressure and she is aware of this.  We discussed that I would not recommend any intervention currently given her asymptomatic status.  We will monitor this on an annual basis going forward and she will contact our office with any vertebrobasilar or left arm symptoms of concern.  Continue aspirin and statin agent.

## 2022-07-02 ENCOUNTER — Other Ambulatory Visit (INDEPENDENT_AMBULATORY_CARE_PROVIDER_SITE_OTHER): Payer: Self-pay | Admitting: Nurse Practitioner

## 2022-10-06 ENCOUNTER — Other Ambulatory Visit (INDEPENDENT_AMBULATORY_CARE_PROVIDER_SITE_OTHER): Payer: Self-pay | Admitting: Nurse Practitioner

## 2022-12-30 ENCOUNTER — Other Ambulatory Visit (INDEPENDENT_AMBULATORY_CARE_PROVIDER_SITE_OTHER): Payer: Self-pay | Admitting: Nurse Practitioner

## 2023-05-19 ENCOUNTER — Ambulatory Visit (INDEPENDENT_AMBULATORY_CARE_PROVIDER_SITE_OTHER): Admitting: Vascular Surgery

## 2023-05-19 ENCOUNTER — Ambulatory Visit (INDEPENDENT_AMBULATORY_CARE_PROVIDER_SITE_OTHER)

## 2023-05-19 ENCOUNTER — Encounter (INDEPENDENT_AMBULATORY_CARE_PROVIDER_SITE_OTHER): Payer: Self-pay | Admitting: Vascular Surgery

## 2023-05-19 VITALS — BP 135/91 | HR 83 | Resp 18 | Ht 63.0 in | Wt 134.8 lb

## 2023-05-19 DIAGNOSIS — E785 Hyperlipidemia, unspecified: Secondary | ICD-10-CM | POA: Diagnosis not present

## 2023-05-19 DIAGNOSIS — G458 Other transient cerebral ischemic attacks and related syndromes: Secondary | ICD-10-CM

## 2023-05-19 MED ORDER — FENOFIBRATE 48 MG PO TABS: 48.0000 mg | ORAL_TABLET | Freq: Every day | ORAL | 5 refills | Status: DC

## 2023-05-19 NOTE — Assessment & Plan Note (Signed)
Her duplex today shows no significant carotid disease on either side.  Her right vertebral artery is large and antegrade.  Her left vertebral artery is retrograde.  As long as she remains asymptomatic, I would not recommend any treatment for this.  Her left arm blood pressure is falsely low in her right arm blood pressure is her true blood pressure.  Recheck in 1 year.

## 2023-05-19 NOTE — Progress Notes (Signed)
MRN : [id number]  Joan Johns is a 63 y.o. (1960-08-15) female who presents with chief complaint of  Chief Complaint  Patient presents with   Follow-up    f/u in 6 months with carotid  .  History of Present Illness: Patient returns today in follow up of left subclavian steal syndrome.  She remains asymptomatic with no noticeable differences such as left arm claudication, vertebrobasilar symptoms, or other issues.  No focal neurologic symptoms of cerebrovascular ischemia such as arm or leg weakness or numbness, speech or swallowing difficulty, or temporary monocular blindness.  Her duplex today shows no significant carotid disease on either side.  Her right vertebral artery is large and antegrade.  Her left vertebral artery is retrograde.  Current Outpatient Medications  Medication Sig Dispense Refill   aspirin EC 81 MG tablet Take 1 tablet (81 mg total) by mouth daily. Swallow whole. 30 tablet 12   atorvastatin (LIPITOR) 10 MG tablet TAKE 1 TABLET(10 MG) BY MOUTH DAILY 30 tablet 2   cholecalciferol (VITAMIN D) 25 MCG (1000 UT) tablet Take 1,000 Units by mouth every evening.     diphenhydramine-acetaminophen (TYLENOL PM) 25-500 MG TABS tablet Take 2 tablets by mouth at bedtime.     fenofibrate (TRICOR) 48 MG tablet Take 1 tablet (48 mg total) by mouth daily. 30 tablet 5   gabapentin (NEURONTIN) 100 MG capsule Take 100 mg by mouth as needed.     levothyroxine (SYNTHROID) 75 MCG tablet Take 75 mcg by mouth daily before breakfast.      No current facility-administered medications for this visit.    Past Medical History:  Diagnosis Date   Abdominal pain    Anxiety    when face covered   Hyperlipidemia    Hypothyroidism    Thyroid disease    hypothyroidism    Past Surgical History:  Procedure Laterality Date   ABDOMINAL HYSTERECTOMY     CHOLECYSTECTOMY N/A 12/20/2018   Procedure: LAPAROSCOPIC CHOLECYSTECTOMY;  Surgeon: Duanne Guess, MD;  Location: [facility];  Service:  General;  Laterality: N/A;   HYSTEROSCOPY  07/28/1994   TUBAL LIGATION  03/30/1986     Social History   Tobacco Use   Smoking status: Every Day    Current packs/day: 1.00    Average packs/day: 1 pack/day for 35.0 years (35.0 ttl pk-yrs)    Types: Cigarettes   Smokeless tobacco: Never  Vaping Use   Vaping status: Never Used  Substance Use Topics   Alcohol use: Never   Drug use: Never       Family History  Problem Relation Age of Onset   Celiac disease Sister      Allergies  Allergen Reactions   Ciprofloxacin Nausea And Vomiting    syncope   Codeine Rash     REVIEW OF SYSTEMS (Negative unless checked)  Constitutional: [] Weight loss  [] Fever  [] Chills Cardiac: [] Chest pain   [] Chest pressure   [] Palpitations   [] Shortness of breath when laying flat   [] Shortness of breath at rest   [] Shortness of breath with exertion. Vascular:  [] Pain in legs with walking   [] Pain in legs at rest   [] Pain in legs when laying flat   [] Claudication   [] Pain in feet when walking  [] Pain in feet at rest  [] Pain in feet when laying flat   [] History of DVT   [] Phlebitis   [] Swelling in legs   [] Varicose veins   [] Non-healing ulcers Pulmonary:   [] Uses home oxygen   []   Productive cough   [] Hemoptysis   [] Wheeze  [] COPD   [] Asthma Neurologic:  [] Dizziness  [] Blackouts   [] Seizures   [] History of stroke   [] History of TIA  [] Aphasia   [] Temporary blindness   [] Dysphagia   [] Weakness or numbness in arms   [] Weakness or numbness in legs Musculoskeletal:  [] Arthritis   [] Joint swelling   [] Joint pain   [] Low back pain Hematologic:  [] Easy bruising  [] Easy bleeding   [] Hypercoagulable state   [] Anemic   Gastrointestinal:  [] Blood in stool   [] Vomiting blood  [] Gastroesophageal reflux/heartburn   [x] Abdominal pain Genitourinary:  [] Chronic kidney disease   [] Difficult urination  [] Frequent urination  [] Burning with urination   [] Hematuria Skin:  [] Rashes   [] Ulcers   [] Wounds Psychological:   [x] History of anxiety   []  History of major depression.  Physical Examination  BP (!) 135/91   Pulse 83   Resp 18   Ht 5\' 3"  (1.6 m)   Wt 134 lb 12.8 oz (61.1 kg)   BMI 23.88 kg/m  Gen:  WD/WN, NAD Head: [place]/AT, No temporalis wasting. Ear/Nose/Throat: Hearing grossly intact, nares w/o erythema or drainage Eyes: Conjunctiva clear. Sclera non-icteric Neck: Supple.  Trachea midline Pulmonary:  Good air movement, no use of accessory muscles.  Cardiac: RRR, no JVD Vascular:  Vessel Right Left  Radial 2+ Palpable 1+ Palpable               Musculoskeletal: M/S 5/5 throughout.  No deformity or atrophy. No edema. Neurologic: Sensation grossly intact in extremities.  Symmetrical.  Speech is fluent.  Psychiatric: Judgment intact, Mood & affect appropriate for pt's clinical situation. Dermatologic: No rashes or ulcers noted.  No cellulitis or open wounds.      Labs No results found for this or any previous visit (from the past 2160 hours).  Radiology No results found.  Assessment/Plan  Subclavian steal syndrome Her duplex today shows no significant carotid disease on either side.  Her right vertebral artery is large and antegrade.  Her left vertebral artery is retrograde.  As long as she remains asymptomatic, I would not recommend any treatment for this.  Her left arm blood pressure is falsely low in her right arm blood pressure is her true blood pressure.  Recheck in 1 year.  Hyperlipidemia lipid control important in reducing the progression of atherosclerotic disease.  Has been unable to tolerate statin therapy.  Asks if we can call in a different medication so we will call in Tricor.    Festus Barren, MD  05/19/2023 2:49 PM    This note was created with Dragon medical transcription system.  Any errors from dictation are purely unintentional

## 2023-05-19 NOTE — Assessment & Plan Note (Signed)
lipid control important in reducing the progression of atherosclerotic disease.  Has been unable to tolerate statin therapy.  Asks if we can call in a different medication so we will call in Tricor.

## 2023-06-18 ENCOUNTER — Other Ambulatory Visit: Payer: Self-pay | Admitting: Family Medicine

## 2023-06-18 DIAGNOSIS — R911 Solitary pulmonary nodule: Secondary | ICD-10-CM

## 2023-07-08 ENCOUNTER — Ambulatory Visit
Admission: RE | Admit: 2023-07-08 | Discharge: 2023-07-08 | Disposition: A | Discharge status: Home / Self Care | Source: Ambulatory Visit | Attending: Family Medicine | Admitting: Family Medicine

## 2023-07-08 DIAGNOSIS — R911 Solitary pulmonary nodule: Secondary | ICD-10-CM | POA: Insufficient documentation

## 2023-10-23 ENCOUNTER — Other Ambulatory Visit (HOSPITAL_COMMUNITY): Payer: Self-pay

## 2023-11-14 ENCOUNTER — Other Ambulatory Visit (INDEPENDENT_AMBULATORY_CARE_PROVIDER_SITE_OTHER): Payer: Self-pay | Admitting: Vascular Surgery

## 2024-05-17 ENCOUNTER — Encounter (INDEPENDENT_AMBULATORY_CARE_PROVIDER_SITE_OTHER)

## 2024-05-17 ENCOUNTER — Ambulatory Visit (INDEPENDENT_AMBULATORY_CARE_PROVIDER_SITE_OTHER): Admitting: Vascular Surgery
